# Patient Record
Sex: Female | Born: 1950 | Race: White | Hispanic: No | Marital: Married | Smoking: Never smoker
Health system: Southern US, Community
[De-identification: ages and names within clinical notes are randomized; demographics above are authoritative.]

## PROBLEM LIST (undated history)

## (undated) DIAGNOSIS — I10 Essential (primary) hypertension: Secondary | ICD-10-CM

## (undated) DIAGNOSIS — Z9109 Other allergy status, other than to drugs and biological substances: Secondary | ICD-10-CM

---

## 2005-10-27 ENCOUNTER — Ambulatory Visit: Payer: Self-pay

## 2008-03-13 ENCOUNTER — Emergency Department: Payer: Self-pay | Admitting: Emergency Medicine

## 2008-10-27 ENCOUNTER — Encounter: Admission: RE | Admit: 2008-10-27 | Discharge: 2008-10-27 | Payer: Self-pay | Admitting: Occupational Medicine

## 2009-02-08 ENCOUNTER — Ambulatory Visit: Payer: Self-pay | Admitting: Sports Medicine

## 2009-02-08 DIAGNOSIS — M7750 Other enthesopathy of unspecified foot: Secondary | ICD-10-CM

## 2009-03-01 ENCOUNTER — Ambulatory Visit: Payer: Self-pay | Admitting: Sports Medicine

## 2009-03-01 ENCOUNTER — Encounter (INDEPENDENT_AMBULATORY_CARE_PROVIDER_SITE_OTHER): Payer: Self-pay | Admitting: *Deleted

## 2009-03-01 DIAGNOSIS — M25476 Effusion, unspecified foot: Secondary | ICD-10-CM

## 2009-03-01 DIAGNOSIS — M205X9 Other deformities of toe(s) (acquired), unspecified foot: Secondary | ICD-10-CM

## 2009-03-01 DIAGNOSIS — M25473 Effusion, unspecified ankle: Secondary | ICD-10-CM

## 2009-06-05 ENCOUNTER — Telehealth (INDEPENDENT_AMBULATORY_CARE_PROVIDER_SITE_OTHER): Payer: Self-pay | Admitting: *Deleted

## 2009-06-07 ENCOUNTER — Ambulatory Visit: Payer: Self-pay

## 2009-06-07 DIAGNOSIS — R030 Elevated blood-pressure reading, without diagnosis of hypertension: Secondary | ICD-10-CM | POA: Insufficient documentation

## 2009-06-15 ENCOUNTER — Encounter: Payer: Self-pay | Admitting: Sports Medicine

## 2009-06-18 ENCOUNTER — Telehealth: Payer: Self-pay | Admitting: Sports Medicine

## 2009-06-20 ENCOUNTER — Encounter: Payer: Self-pay | Admitting: Sports Medicine

## 2009-07-11 ENCOUNTER — Ambulatory Visit: Payer: Self-pay | Admitting: Sports Medicine

## 2009-07-11 DIAGNOSIS — S92109A Unspecified fracture of unspecified talus, initial encounter for closed fracture: Secondary | ICD-10-CM

## 2011-04-17 ENCOUNTER — Ambulatory Visit: Payer: Self-pay

## 2012-09-27 ENCOUNTER — Ambulatory Visit: Payer: Self-pay

## 2012-10-27 ENCOUNTER — Ambulatory Visit: Payer: Self-pay

## 2012-11-22 ENCOUNTER — Ambulatory Visit: Payer: Self-pay | Admitting: Urology

## 2012-11-22 DIAGNOSIS — I10 Essential (primary) hypertension: Secondary | ICD-10-CM

## 2012-11-22 LAB — BASIC METABOLIC PANEL
Anion Gap: 6 — ABNORMAL LOW (ref 7–16)
Calcium, Total: 8.9 mg/dL (ref 8.5–10.1)
EGFR (African American): >60
EGFR (Non-African Amer.): >60
Glucose: 101 mg/dL — ABNORMAL HIGH (ref 65–99)
Sodium: 141 mmol/L (ref 136–145)

## 2014-07-10 IMAGING — CT CT STONE STUDY
1 of 2 series · 15 of 32 positions shown, 19 images · non-contrast
Comparison: None

REASON FOR EXAM: RUQ pain radiating to back Neg US Flank and back pain
ongoing for several mo...
COMMENTS:

PROCEDURE:     KCT - KCT ABDOMEN/PELVIS WO (STONE)  - October 27, 2012  [DATE]
RESULT:     Indication: Flank Pain
TECHNIQUE: Multiple axial images from the lung bases to the symphysis pubis
were obtained without oral and without intravenous contrast.

[Series 2: abd 3mm wo 3.0 i40f 3 · axial · 0.69mm/px · z∈[-956,-581]mm · 15 of 137 slices shown, 19 images]
[im 6/137  soft-tissue]
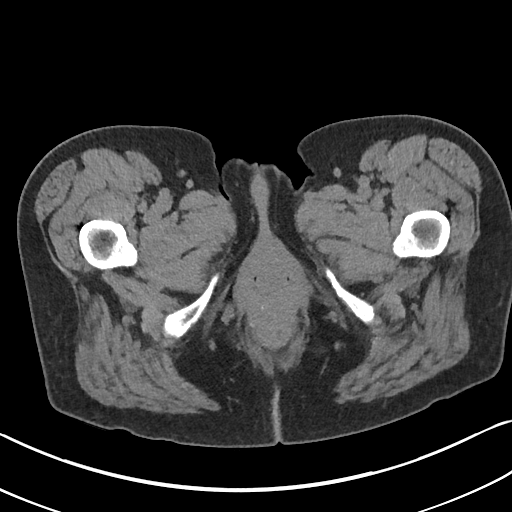
[im 6/137  bone]
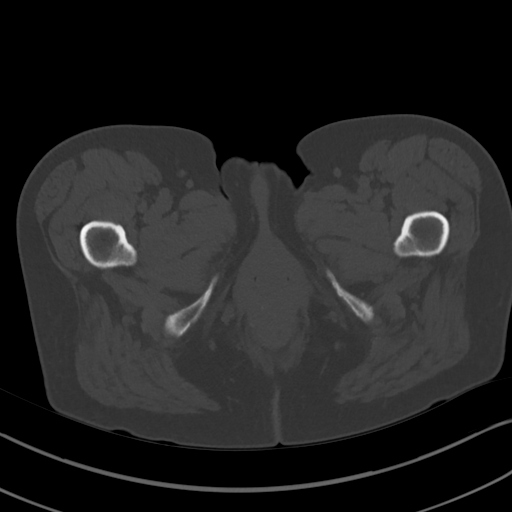
[im 16/137  soft-tissue]
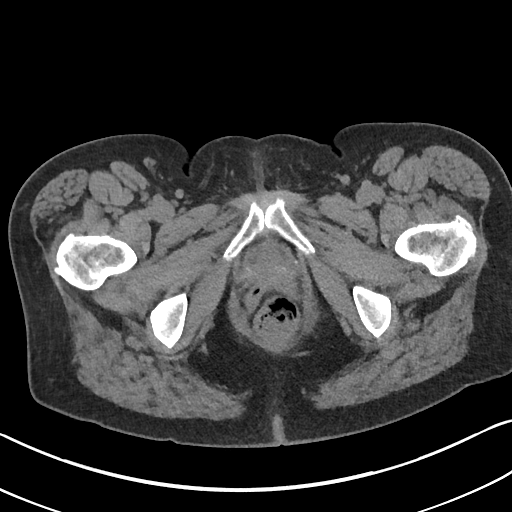
[im 27/137  soft-tissue]
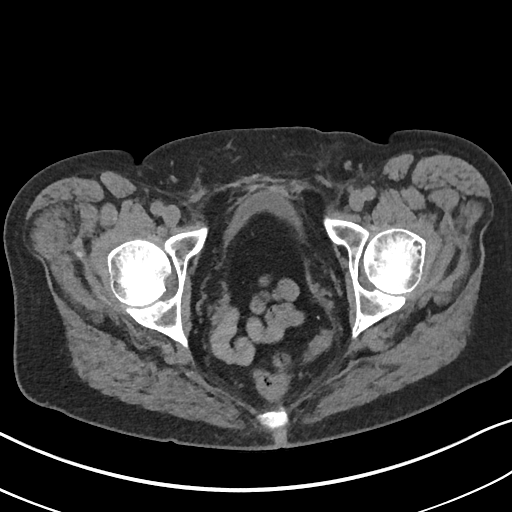
[im 37/137  soft-tissue]
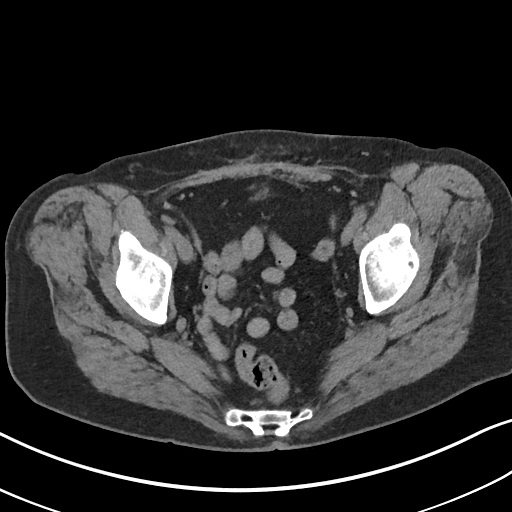
[im 48/137  soft-tissue]
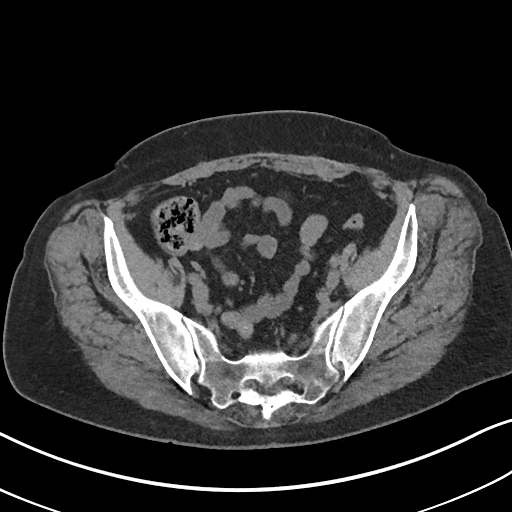
[im 58/137  soft-tissue]
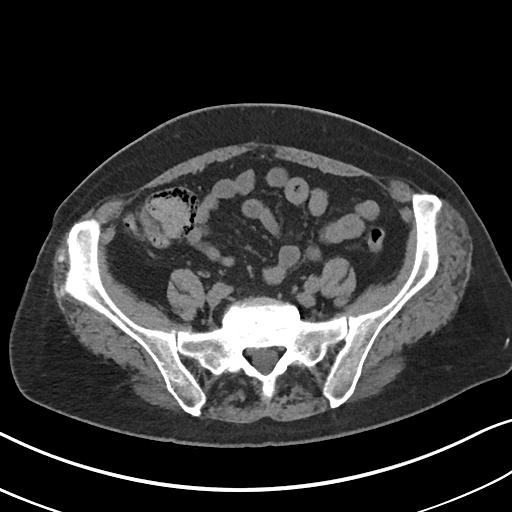
[im 69/137  soft-tissue]
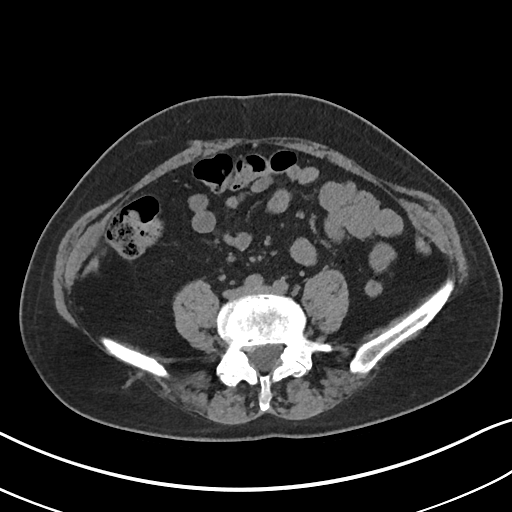
[im 79/137  soft-tissue]
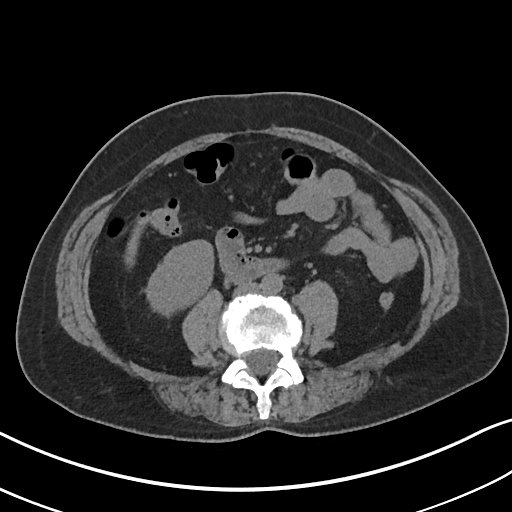
[im 89/137  soft-tissue]
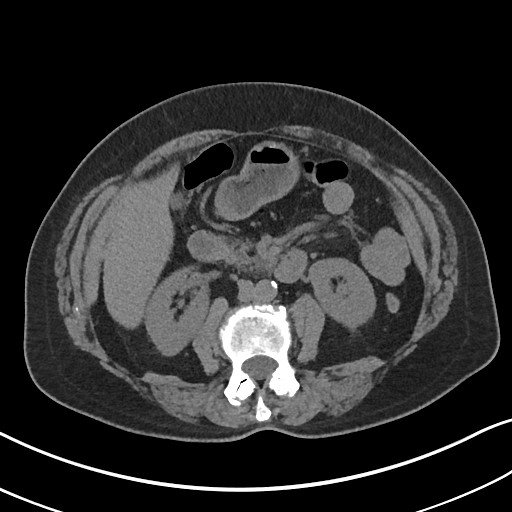
[im 89/137  bone]
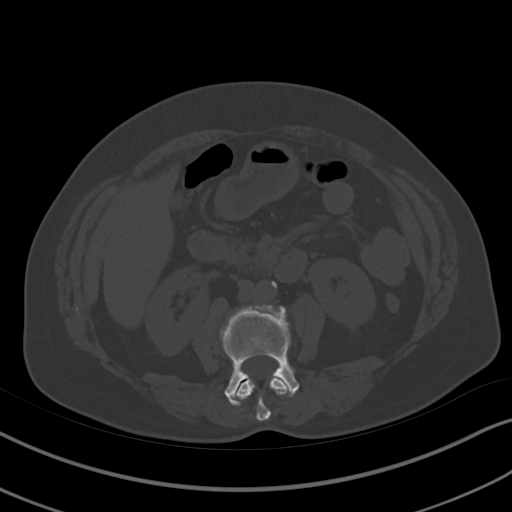
[im 100/137  soft-tissue]
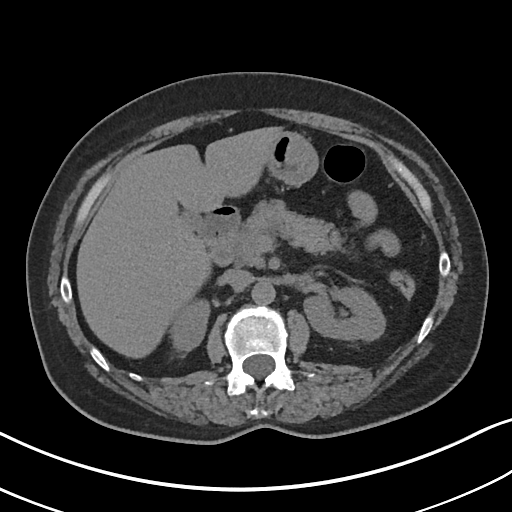
[im 110/137  soft-tissue]
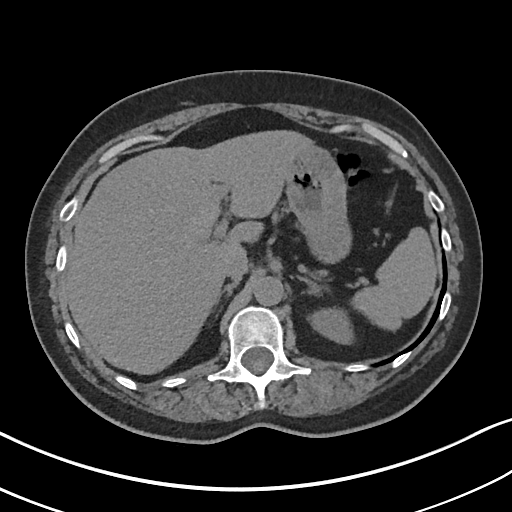
[im 116/137  lung]
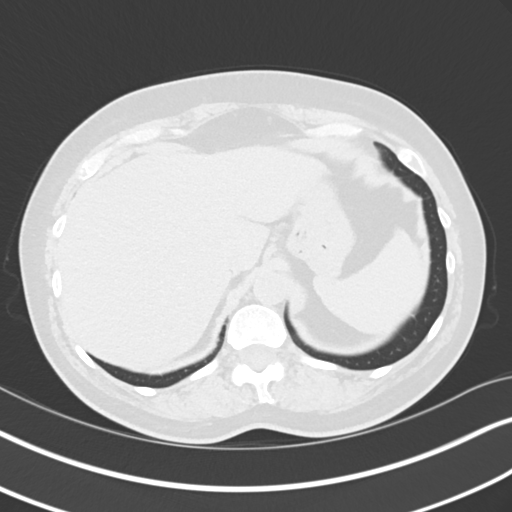
[im 121/137  soft-tissue]
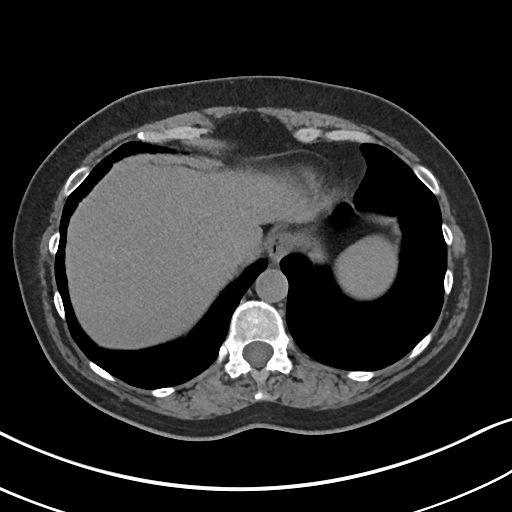
[im 121/137  lung]
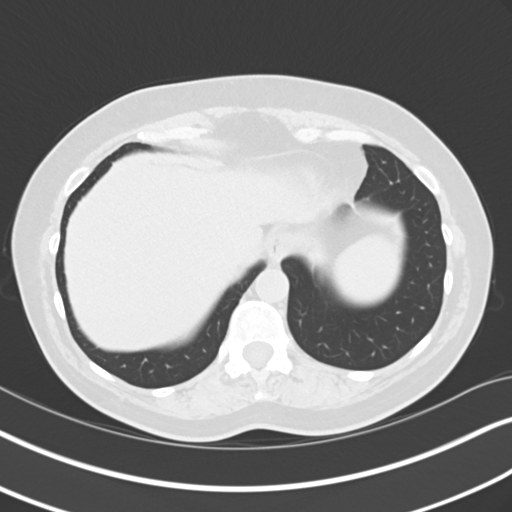
[im 126/137  lung]
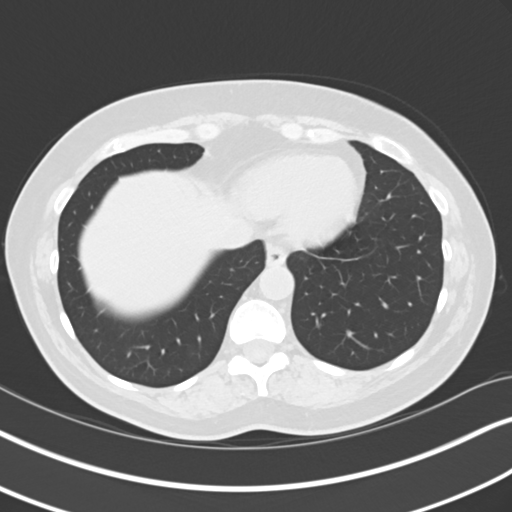
[im 131/137  soft-tissue]
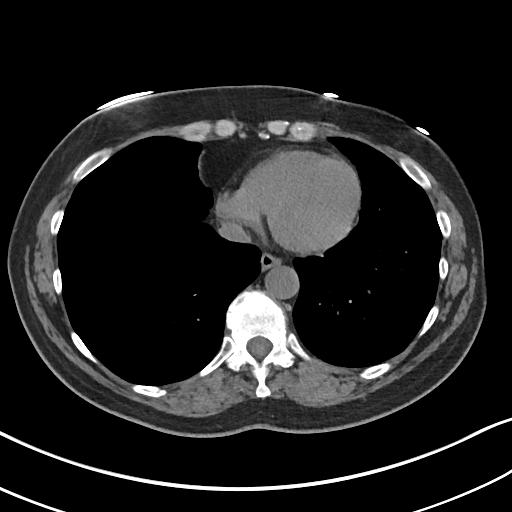
[im 131/137  lung]
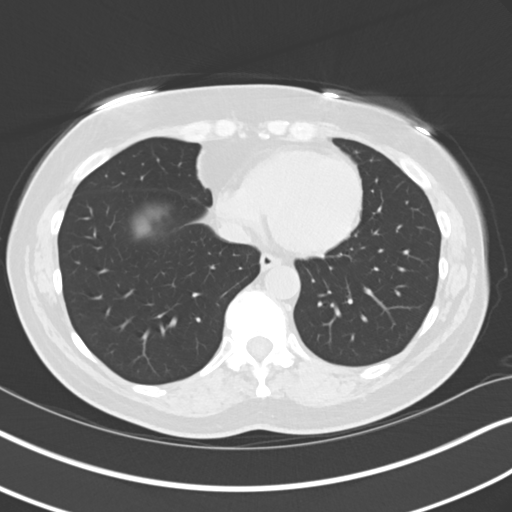

[15 of 32 positions shown; findings below may reference images not displayed]

FINDINGS: The lung bases are clear. There is no pleural or pericardial effusions.

No renal, ureteral, or bladder calculi. No obstructive uropathy. No
perinephric stranding is seen. The kidneys are symmetric in size without
evidence for exophytic mass. The bladder is unremarkable.

The liver demonstrates no focal abnormality. The gallbladder is
unremarkable. The spleen demonstrates no focal abnormality. The adrenal
glands and pancreas are normal.

The unopacified stomach, duodenum, small intestine, and large intestine are
unremarkable, but evaluation is limited by lack of oral contrast. There is
haziness in the left side of the mesenteric fat with a few prominent on
pathologically enlarged lymph nodes in this region as can be seen with
mesenteric adenitis. There is no pneumoperitoneum, pneumatosis, or portal
venous gas. There is no abdominal or pelvic free fluid. There is no
lymphadenopathy.

The abdominal aorta is normal in caliber.

The osseous structures are unremarkable.
IMPRESSION: 1. No urolithiasis or obstructive uropathy.

2. There is haziness in the left side of the mesenteric fat with a few
prominent on pathologically enlarged lymph nodes in this region as can be
seen with mesenteric adenitis.

[REDACTED]

## 2015-03-13 NOTE — H&P (Signed)
PATIENT NAME:  Alison Hurst, Alison Hurst MR#:  [id number] DATE OF BIRTH:  [birth year]  DATE OF ADMISSION:  11/22/2012  CHIEF COMPLAINT: Painful bladder.   HISTORY OF PRESENT ILLNESS: Alison Hurst is a 64 year old Alison Hurst female with a 2 to 3 year history of pelvic pain, frequency and urgency. She also has mixed urinary incontinence. She was found to have a UTI in early December and treated with a one-week course of Augmentin. Her symptoms have only improved slightly. She comes in now for cysto with hydrodilatation.  ALLERGIES: No drug allergies.   CURRENT MEDICATIONS: Amitriptyline, losartan, HCTZ and Xanax.   PAST SURGICAL HISTORY: Appendectomy and hysterectomy in 1979, pubovaginal sling and cystocele repair in 1997.   SOCIAL HISTORY: The patient denied tobacco use. She consumes 1 to 4 alcoholic beverages per week.   FAMILY HISTORY: Remarkable for father with lung cancer.   PAST AND CURRENT MEDICAL CONDITIONS:  1. Hypertension.  2. Depression.   REVIEW OF SYSTEMS: The patient denied chest pain, shortness of breath, diabetes or heart disease.   PHYSICAL EXAMINATION:  GENERAL: Well-nourished Alison Hurst female in no acute distress.   HEENT: Sclerae were clear. Pupils were equally round and reactive to light and accommodation. Extraocular movements were intact.   NECK: Supple. No palpable cervical lymphadenopathy. No audible carotid bruits.   PULMONARY: Lungs are clear to auscultation.   CARDIOVASCULAR: Regular rhythm and rate without audible murmurs.   ABDOMEN: Soft, nontender abdomen.   GENITOURINARY: Atrophic external genitalia. Good anterior support with negative Marshall test. No vaginal discharge. Normal urethral meatus. Postvoid residual was 130 mL.   RECTAL: No palpable rectal masses.   NEUROMUSCULAR: Alert and oriented x 3, nonfocal.   IMPRESSION: Painful bladder syndrome.   PLAN: Cystoscopy with hydrodilatation. ____________________________ Suszanne ConnersMichael R. Evelene CroonWolff, MD mrw:[id number] D: 11/15/2012  11:52:25 ET T: 11/15/2012 12:03:05 ET JOB#: [id number]  cc: Suszanne ConnersMichael R. Evelene CroonWolff, MD, <Dictator> Orson ApeMICHAEL R Annette Bertelson MD ELECTRONICALLY SIGNED 11/15/2012 15:58

## 2018-09-01 ENCOUNTER — Other Ambulatory Visit: Payer: Self-pay | Admitting: Nurse Practitioner

## 2018-09-01 DIAGNOSIS — Z1231 Encounter for screening mammogram for malignant neoplasm of breast: Secondary | ICD-10-CM

## 2020-01-08 ENCOUNTER — Other Ambulatory Visit: Payer: Self-pay

## 2020-01-08 ENCOUNTER — Ambulatory Visit
Admission: EM | Admit: 2020-01-08 | Discharge: 2020-01-08 | Disposition: A | Discharge status: Home / Self Care | Payer: Medicare PPO | Attending: Family Medicine | Admitting: Family Medicine

## 2020-01-08 ENCOUNTER — Ambulatory Visit (INDEPENDENT_AMBULATORY_CARE_PROVIDER_SITE_OTHER): Payer: Medicare PPO

## 2020-01-08 DIAGNOSIS — M545 Low back pain, unspecified: Secondary | ICD-10-CM

## 2020-01-08 HISTORY — DX: Other allergy status, other than to drugs and biological substances: Z91.09

## 2020-01-08 HISTORY — DX: Essential (primary) hypertension: I10

## 2020-01-08 MED ORDER — MELOXICAM 7.5 MG PO TABS: 7.5000 mg | ORAL_TABLET | Freq: Every day | ORAL | 0 refills | Status: AC | PRN

## 2020-01-08 MED ORDER — BACLOFEN 10 MG PO TABS: 10.0000 mg | ORAL_TABLET | Freq: Three times a day (TID) | ORAL | 0 refills | Status: DC | PRN

## 2020-01-08 NOTE — ED Triage Notes (Signed)
Pt presents with c/o fall this past Friday while at home. She fell backward while in the kitchen, she landed on her lower back. She reports LBP, pain with movement and ambulation. She denies any radiating pain to her LEs. She denies any other symptoms.

## 2020-01-08 NOTE — ED Provider Notes (Signed)
[facility]    CSN: [id number] Arrival date & time: 01/08/20  1228      History   Chief Complaint Chief Complaint  Patient presents with  . Back Pain   HPI   69 year old female presents with back pain/sacral pain.  Patient states that she suffered a fall on Friday.  She states that she was getting some food out of the oven and subsequently fell backwards.  She states that she injured her low back/sacrum.  She reports severe pain which is worse with ambulation.  Rates her pain as 8/10 in severity.  Described as sharp.  He has taken ibuprofen without relief.  No saddle anesthesia or incontinence.  No other reported symptoms.  No other complaints.  Past Medical History:  Diagnosis Date  . Environmental allergies   . Hypertension    Patient Active Problem List   Diagnosis Date Noted  . CLOSED FRACTURE OF ASTRAGALUS 07/11/2009  . ELEVATED BLOOD PRESSURE 06/07/2009  . JOINT EFFUSION, ANKLE 03/01/2009  . OTHER ACQUIRED DEFORMITY OF TOE 03/01/2009  . 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 ABDOMINAL HYSTERECTOMY    . APPENDECTOMY    . BLADDER SURGERY      OB History   No obstetric history on file.      Home Medications    Prior to Admission medications   Medication Sig Start Date End Date Taking? Authorizing Provider  ALPRAZolam Prudy Feeler) 0.5 MG tablet Take 0.25 mg by mouth daily as needed. 12/23/19  Yes [provider]  amitriptyline (ELAVIL) 75 MG tablet Take 75 mg by mouth at bedtime. 11/07/19  Yes [provider]  amLODipine (NORVASC) 5 MG tablet Take 5 mg by mouth daily. 11/18/19  Yes [provider]  busPIRone (BUSPAR) 10 MG tablet Take 10 mg by mouth 3 (three) times daily. 12/19/19  Yes [provider]  fluticasone (FLONASE) 50 MCG/ACT nasal spray Place 2 sprays into both nostrils daily as needed. 08/14/19  Yes [provider]  hydrochlorothiazide (HYDRODIURIL) 25  MG tablet Take 25 mg by mouth daily. 12/10/19  Yes [provider]  losartan (COZAAR) 100 MG tablet Take 100 mg by mouth daily. 12/10/19  Yes [provider]  montelukast (SINGULAIR) 10 MG tablet Take 1 tablet by mouth at bedtime. 07/29/19  Yes [provider]  baclofen (LIORESAL) 10 MG tablet Take 1 tablet (10 mg total) by mouth 3 (three) times daily as needed for muscle spasms. 01/08/20   Tommie Sams, DO  meloxicam (MOBIC) 7.5 MG tablet Take 1 tablet (7.5 mg total) by mouth daily as needed for pain. 01/08/20   Tommie Sams, DO    Family History Family History  Problem Relation Age of Onset  . Healthy Mother   . Healthy Father     Social History Social History   Tobacco Use  . Smoking status: Never Smoker  . Smokeless tobacco: Never Used  Substance Use Topics  . Alcohol use: Not Currently  . Drug use: Never     Allergies   Patient has no known allergies.   Review of Systems Review of Systems  Constitutional: Negative.   Musculoskeletal: Positive for back pain.   Physical Exam Triage Vital Signs ED Triage Vitals  Enc Vitals Group     BP 01/08/20 1259 119/84     Pulse Rate 01/08/20 1259 (!) 108     Resp --      Temp 01/08/20 1259 98.9  F (37.2 C)     Temp Source 01/08/20 1259 Oral     SpO2 01/08/20 1259 98 %     Weight 01/08/20 1254 148 lb (67.1 kg)     Height 01/08/20 1254 5' 3.5" (1.613 m)     Head Circumference --      Peak Flow --      Pain Score 01/08/20 1254 8     Pain Loc --      Pain Edu? --      Excl. in GC? --    Updated Vital Signs BP 119/84 (BP Location: Left Arm)   Pulse (!) 108   Temp 98.9 F (37.2 C) (Oral)   Ht 5' 3.5" (1.613 m)   Wt 67.1 kg   SpO2 98%   BMI 25.81 kg/m   Visual Acuity Right Eye Distance:   Left Eye Distance:   Bilateral Distance:    Right Eye Near:   Left Eye Near:    Bilateral Near:     Physical Exam Vitals and nursing note reviewed.  Constitutional:      Appearance: She is not  ill-appearing.     Comments: Appears in pain.  HENT:     Head: Normocephalic and atraumatic.  Eyes:     General:        Right eye: No discharge.        Left eye: No discharge.     Conjunctiva/sclera: Conjunctivae normal.  Cardiovascular:     Rate and Rhythm: Regular rhythm. Tachycardia present.  Pulmonary:     Effort: Pulmonary effort is normal.     Breath sounds: Normal breath sounds. No wheezing, rhonchi or rales.  Musculoskeletal:       Back:     Comments: Patient with tenderness over the lower lumbar spine in the midline as well as the sacrum.  Neurological:     Mental Status: She is alert.  Psychiatric:        Mood and Affect: Mood normal.        Behavior: Behavior normal.    UC Treatments / Results  Labs (all labs ordered are listed, but only abnormal results are displayed) Labs Reviewed - No data to display  EKG   Radiology DG Lumbar Spine Complete  Result Date: 01/08/2020 CLINICAL DATA:  Fall 2 days ago with back pain, initial encounter EXAM: LUMBAR SPINE - COMPLETE 4+ VIEW COMPARISON:  None. FINDINGS: Five lumbar type vertebral bodies are well visualized. Vertebral body height is well maintained. Multilevel osteophytic changes and disc space narrowing is seen. Facet hypertrophic changes are noted without pars defects. No anterolisthesis is noted. No soft tissue abnormality is noted. IMPRESSION: Multilevel degenerative change without acute abnormality. Electronically Signed   By: Alcide Clever M.D.   On: 01/08/2020 13:38   DG Sacrum/Coccyx  Result Date: 01/08/2020 CLINICAL DATA:  Fall 2 days ago with sacral pain, initial encounter EXAM: SACRUM AND COCCYX - 2+ VIEW COMPARISON:  None. FINDINGS: Pelvic ring is intact. Sacrum is well visualized without focal abnormality. No soft tissue changes are seen. IMPRESSION: No acute abnormality noted. Electronically Signed   By: Alcide Clever M.D.   On: 01/08/2020 13:38    Procedures Procedures (including critical care  time)  Medications Ordered in UC Medications - No data to display  Initial Impression / Assessment and Plan / UC Course  I have reviewed the triage vital signs and the nursing notes.  Pertinent labs & imaging results that were available during my care of the  patient were reviewed by me and considered in my medical decision making (see chart for details).    69 year old female presents with low back pain after suffering a fall.  X-rays negative for acute findings.  Meloxicam baclofen as directed.  Supportive care.  Final Clinical Impressions(s) / UC Diagnoses   Final diagnoses:  Acute bilateral low back pain without sciatica     Discharge Instructions     Heat.  Medication as prescribed.  Take care  Dr. Lacinda Axon     ED Prescriptions    Medication Sig Dispense Auth. Provider   meloxicam (MOBIC) 7.5 MG tablet Take 1 tablet (7.5 mg total) by mouth daily as needed for pain. 14 tablet Dannica Bickham G, DO   baclofen (LIORESAL) 10 MG tablet Take 1 tablet (10 mg total) by mouth 3 (three) times daily as needed for muscle spasms. 78 each Coral Spikes, DO     PDMP not reviewed this encounter.   Coral Spikes, [place] 01/08/20 1403

## 2020-01-08 NOTE — Discharge Instructions (Signed)
Heat.  Medication as prescribed.  Take care  Dr. Jaclene Bartelt  

## 2021-04-24 ENCOUNTER — Other Ambulatory Visit: Payer: Self-pay

## 2021-04-24 ENCOUNTER — Encounter: Payer: Self-pay | Admitting: Emergency Medicine

## 2021-04-24 ENCOUNTER — Ambulatory Visit
Admission: EM | Admit: 2021-04-24 | Discharge: 2021-04-24 | Disposition: A | Discharge status: Home / Self Care | Payer: Medicare PPO | Attending: Family Medicine | Admitting: Family Medicine

## 2021-04-24 DIAGNOSIS — Z28311 Partially vaccinated for covid-19: Secondary | ICD-10-CM | POA: Diagnosis not present

## 2021-04-24 DIAGNOSIS — I10 Essential (primary) hypertension: Secondary | ICD-10-CM | POA: Diagnosis not present

## 2021-04-24 DIAGNOSIS — Z20822 Contact with and (suspected) exposure to covid-19: Secondary | ICD-10-CM | POA: Diagnosis not present

## 2021-04-24 DIAGNOSIS — R051 Acute cough: Secondary | ICD-10-CM | POA: Diagnosis not present

## 2021-04-24 DIAGNOSIS — Z79899 Other long term (current) drug therapy: Secondary | ICD-10-CM | POA: Insufficient documentation

## 2021-04-24 DIAGNOSIS — J029 Acute pharyngitis, unspecified: Secondary | ICD-10-CM | POA: Diagnosis not present

## 2021-04-24 DIAGNOSIS — J069 Acute upper respiratory infection, unspecified: Secondary | ICD-10-CM | POA: Diagnosis not present

## 2021-04-24 DIAGNOSIS — H9202 Otalgia, left ear: Secondary | ICD-10-CM | POA: Insufficient documentation

## 2021-04-24 DIAGNOSIS — R059 Cough, unspecified: Secondary | ICD-10-CM | POA: Diagnosis not present

## 2021-04-24 LAB — GROUP A STREP BY PCR: Group A Strep by PCR: NOT DETECTED

## 2021-04-24 MED ORDER — HYDROCOD POLST-CPM POLST ER 10-8 MG/5ML PO SUER: 5.0000 mL | Freq: Two times a day (BID) | ORAL | 0 refills | Status: AC | PRN

## 2021-04-24 NOTE — ED Triage Notes (Signed)
Patient c/o cough and left ear pain that started Monday night. Denies fever.

## 2021-04-24 NOTE — Discharge Instructions (Signed)
I will call you if your strep test is positive.  The COVID test should be back tomorrow.  If you test positive for COVID-19 we will give you a call and we can consider starting you on an antiviral medicine.  If those tests are negative and this is likely another viral illness and will take about 7 to 10 days to run its course.  Care is supportive with increasing rest and fluids.  I sent a cough medicine for you.  Take this only if absolutely needed since it is strong.  I have also sent a nasal spray.  Your ear pain is likely due to eustachian tube dysfunction related to the fluid behind her ear.  It should get better over the next few days.  If you start to run a fever or feel worse or have difficulty breathing need to be seen again.  See more information about COVID below.  You have received COVID testing today either for positive exposure, concerning symptoms that could be related to COVID infection, screening purposes, or re-testing after confirmed positive.  Your test obtained today checks for active viral infection in the last 1-2 weeks. If your test is negative now, you can still test positive later. So, if you do develop symptoms you should either get re-tested and/or isolate x 5 days and then strict mask use x 5 days (unvaccinated) or mask use x 10 days (vaccinated). Please follow CDC guidelines.  While Rapid antigen tests come back in 15-20 minutes, send out PCR/molecular test results typically come back within 1-3 days. In the mean time, if you are symptomatic, assume this could be a positive test and treat/monitor yourself as if you do have COVID.   We will call with test results if positive. Please download the MyChart app and set up a profile to access test results.   If symptomatic, go home and rest. Push fluids. Take Tylenol as needed for discomfort. Gargle warm salt water. Throat lozenges. Take Mucinex DM or Robitussin for cough. Humidifier in bedroom to ease coughing. Warm showers. Also  review the COVID handout for more information.  COVID-19 INFECTION: The incubation period of COVID-19 is approximately 14 days after exposure, with most symptoms developing in roughly 4-5 days. Symptoms may range in severity from mild to critically severe. Roughly 80% of those infected will have mild symptoms. People of any age may become infected with COVID-19 and have the ability to transmit the virus. The most common symptoms include: fever, fatigue, cough, body aches, headaches, sore throat, nasal congestion, shortness of breath, nausea, vomiting, diarrhea, changes in smell and/or taste.    COURSE OF ILLNESS Some patients may begin with mild disease which can progress quickly into critical symptoms. If your symptoms are worsening please call ahead to the Emergency Department and proceed there for further treatment. Recovery time appears to be roughly 1-2 weeks for mild symptoms and 3-6 weeks for severe disease.   GO IMMEDIATELY TO ER FOR FEVER YOU ARE UNABLE TO GET DOWN WITH TYLENOL, BREATHING PROBLEMS, CHEST PAIN, FATIGUE, LETHARGY, INABILITY TO EAT OR DRINK, ETC  QUARANTINE AND ISOLATION: To help decrease the spread of COVID-19 please remain isolated if you have COVID infection or are highly suspected to have COVID infection. This means -stay home and isolate to one room in the home if you live with others. Do not share a bed or bathroom with others while ill, sanitize and wipe down all countertops and keep common areas clean and disinfected. Stay home for  5 days. If you have no symptoms or your symptoms are resolving after 5 days, you can leave your house. Continue to wear a mask around others for 5 additional days. If you have been in close contact (within 6 feet) of someone diagnosed with COVID 19, you are advised to quarantine in your home for 14 days as symptoms can develop anywhere from 2-14 days after exposure to the virus. If you develop symptoms, you  must isolate.  Most current guidelines  for COVID after exposure -unvaccinated: isolate 5 days and strict mask use x 5 days. Test on day 5 is possible -vaccinated: wear mask x 10 days if symptoms do not develop -You do not necessarily need to be tested for COVID if you have + exposure and  develop symptoms. Just isolate at home x10 days from symptom onset During this global pandemic, CDC advises to practice social distancing, try to stay at least 40ft away from others at all times. Wear a face covering. Wash and sanitize your hands regularly and avoid going anywhere that is not necessary.  KEEP IN MIND THAT THE COVID TEST IS NOT 100% ACCURATE AND YOU SHOULD STILL DO EVERYTHING TO PREVENT POTENTIAL SPREAD OF VIRUS TO OTHERS (WEAR MASK, WEAR GLOVES, WASH HANDS AND SANITIZE REGULARLY). IF INITIAL TEST IS NEGATIVE, THIS MAY NOT MEAN YOU ARE DEFINITELY NEGATIVE. MOST ACCURATE TESTING IS DONE 5-7 DAYS AFTER EXPOSURE.   It is not advised by CDC to get re-tested after receiving a positive COVID test since you can still test positive for weeks to months after you have already cleared the virus.   *If you have not been vaccinated for COVID, I strongly suggest you consider getting vaccinated as long as there are no contraindications.

## 2021-04-24 NOTE — ED Provider Notes (Signed)
[facility]    CSN: [id number] Arrival date & time: 04/24/21  1305      History   Chief Complaint Chief Complaint  Patient presents with  . Cough  . Ear Pain    HPI Alison Hurst is a 70 y.o. female   presenting for left-sided sharp ear pain that is intermittent, cough, mild nasal congestion, and sore throat x2 to 3 days.  She denies any fevers, fatigue, sinus pain, chest pain, wheezing or shortness of breath.  No abdominal pain, nausea/vomiting or diarrhea.  Patient exposed to sick grandchildren.  She states that one of them had a cough and a fever.  No known COVID exposure but the children were not tested.  Patient vaccinated for COVID-19 x2.  She has been taking over-the-counter decongestants without improvement in her symptoms.  Patient concerned because she said her cough is keeping her up at night.  Patient would like something stronger for cough.  Says that she has tried multiple over-the-counter medications and they have not helped.  Medical history significant for hypertension.  She has no other concerns.  HPI  Past Medical History:  Diagnosis Date  . Environmental allergies   . Hypertension     Patient Active Problem List   Diagnosis Date Noted  . CLOSED FRACTURE OF ASTRAGALUS 07/11/2009  . ELEVATED BLOOD PRESSURE 06/07/2009  . JOINT EFFUSION, ANKLE 03/01/2009  . OTHER ACQUIRED DEFORMITY OF TOE 03/01/2009  . 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 ABDOMINAL HYSTERECTOMY    . APPENDECTOMY    . BLADDER SURGERY      OB History   No obstetric history on file.      Home Medications    Prior to Admission medications   Medication Sig Start Date End Date Taking? Authorizing Provider  ALPRAZolam Prudy Feeler) 0.5 MG tablet Take 0.25 mg by mouth daily as needed. 12/23/19  Yes [provider]  amitriptyline (ELAVIL) 75 MG tablet Take 75 mg by mouth at bedtime. 11/07/19  Yes [provider]   amLODipine (NORVASC) 5 MG tablet Take 5 mg by mouth daily. 11/18/19  Yes [provider]  baclofen (LIORESAL) 10 MG tablet Take 1 tablet (10 mg total) by mouth 3 (three) times daily as needed for muscle spasms. 01/08/20  Yes Cook, Jayce G, DO  busPIRone (BUSPAR) 10 MG tablet Take 10 mg by mouth 3 (three) times daily. 12/19/19  Yes [provider]  chlorpheniramine-HYDROcodone (TUSSIONEX PENNKINETIC ER) 10-8 MG/5ML SUER Take 5 mLs by mouth every 12 (twelve) hours as needed for up to 7 days for cough. 04/24/21 05/01/21 Yes Eusebio Friendly B, PA-C  fluticasone (FLONASE) 50 MCG/ACT nasal spray Place 2 sprays into both nostrils daily as needed. 08/14/19  Yes [provider]  hydrochlorothiazide (HYDRODIURIL) 25 MG tablet Take 25 mg by mouth daily. 12/10/19  Yes [provider]  losartan (COZAAR) 100 MG tablet Take 100 mg by mouth daily. 12/10/19  Yes [provider]  meloxicam (MOBIC) 7.5 MG tablet Take 1 tablet (7.5 mg total) by mouth daily as needed for pain. 01/08/20  Yes Cook, Jayce G, DO  montelukast (SINGULAIR) 10 MG tablet Take 1 tablet by mouth at bedtime. 07/29/19  Yes [provider]    Family History Family History  Problem Relation Age of Onset  . Healthy Mother   . Healthy Father     Social History Social History   Tobacco Use  . Smoking status:  Never Smoker  . Smokeless tobacco: Never Used  Vaping Use  . Vaping Use: Never used  Substance Use Topics  . Alcohol use: Not Currently  . Drug use: Never     Allergies   Patient has no known allergies.   Review of Systems Review of Systems  Constitutional: Negative for chills, diaphoresis, fatigue and fever.  HENT: Positive for congestion, ear pain, rhinorrhea and sore throat. Negative for sinus pressure and sinus pain.   Respiratory: Positive for cough. Negative for shortness of breath.   Gastrointestinal: Negative for abdominal pain, nausea and vomiting.  Musculoskeletal: Negative  for arthralgias and myalgias.  Skin: Negative for rash.  Neurological: Negative for weakness and headaches.  Hematological: Negative for adenopathy.     Physical Exam Triage Vital Signs ED Triage Vitals  Enc Vitals Group     BP 04/24/21 1346 122/84     Pulse Rate 04/24/21 1346 96     Resp 04/24/21 1346 18     Temp 04/24/21 1346 98.6 F (37 C)     Temp Source 04/24/21 1346 Oral     SpO2 04/24/21 1346 98 %     Weight 04/24/21 1344 143 lb (64.9 kg)     Height 04/24/21 1344 5\' 3"  (1.6 m)     Head Circumference --      Peak Flow --      Pain Score 04/24/21 1344 0     Pain Loc --      Pain Edu? --      Excl. in [place]? --    No data found.  Updated Vital Signs BP 122/84 (BP Location: Right Arm)   Pulse 96   Temp 98.6 F (37 C) (Oral)   Resp 18   Ht 5\' 3"  (1.6 m)   Wt 143 lb (64.9 kg)   SpO2 98%   BMI 25.33 kg/m       Physical Exam Vitals and nursing note reviewed.  Constitutional:      General: She is not in acute distress.    Appearance: Normal appearance. She is not ill-appearing or toxic-appearing.  HENT:     Head: Normocephalic and atraumatic.     Right Ear: Tympanic membrane, ear canal and external ear normal.     Left Ear: Ear canal and external ear normal. A middle ear effusion is present.     Nose: Congestion present.     Mouth/Throat:     Mouth: Mucous membranes are moist.     Pharynx: Oropharynx is clear. Posterior oropharyngeal erythema present.  Eyes:     General: No scleral icterus.       Right eye: No discharge.        Left eye: No discharge.     Conjunctiva/sclera: Conjunctivae normal.  Cardiovascular:     Rate and Rhythm: Normal rate and regular rhythm.     Heart sounds: Normal heart sounds.  Pulmonary:     Effort: Pulmonary effort is normal. No respiratory distress.     Breath sounds: Normal breath sounds. No wheezing, rhonchi or rales.  Musculoskeletal:     Cervical back: Neck supple.  Skin:    General: Skin is dry.  Neurological:      General: No focal deficit present.     Mental Status: She is alert. Mental status is at baseline.     Motor: No weakness.     Gait: Gait normal.  Psychiatric:        Mood and Affect: Mood normal.  Behavior: Behavior normal.        Thought Content: Thought content normal.      UC Treatments / Results  Labs (all labs ordered are listed, but only abnormal results are displayed) Labs Reviewed  GROUP A STREP BY PCR  SARS CORONAVIRUS 2 (TAT 6-24 HRS)    EKG   Radiology No results found.  Procedures Procedures (including critical care time)  Medications Ordered in UC Medications - No data to display  Initial Impression / Assessment and Plan / UC Course  I have reviewed the triage vital signs and the nursing notes.  Pertinent labs & imaging results that were available during my care of the patient were reviewed by me and considered in my medical decision making (see chart for details).   70 year old female presenting for left-sided sharp ear pains, cough, and sore throat x2 to 3 days.  Vital signs all normal and stable and she is overall well-appearing.  Molecular strep test obtained.  Advised patient we will call with results of the are positive. COVID test obtained.  Current CDC guidelines, isolation protocol and ED precautions reviewed with patient.  Advised patient this is likely a viral URI if the strep test is negative and supportive care is advised.  I have sent in Tussionex after reviewing controlled substance database and finding patient to be low risk.  Patient has taken this medication before.  Advised her to use it very safely and only if absolutely needed.  Advised to follow-up if she develops any fevers, severe cough or worsening sore throat or has any breathing difficulty.    Advised patient if she test positive for COVID-19 she may be a candidate for antiviral or antibody therapy.  Negative strep.   Final Clinical Impressions(s) / UC Diagnoses   Final  diagnoses:  Upper respiratory tract infection, unspecified type  Cough  Sore throat     Discharge Instructions     I will call you if your strep test is positive.  The COVID test should be back tomorrow.  If you test positive for COVID-19 we will give you a call and we can consider starting you on an antiviral medicine.  If those tests are negative and this is likely another viral illness and will take about 7 to 10 days to run its course.  Care is supportive with increasing rest and fluids.  I sent a cough medicine for you.  Take this only if absolutely needed since it is strong.  I have also sent a nasal spray.  Your ear pain is likely due to eustachian tube dysfunction related to the fluid behind her ear.  It should get better over the next few days.  If you start to run a fever or feel worse or have difficulty breathing need to be seen again.  See more information about COVID below.  You have received COVID testing today either for positive exposure, concerning symptoms that could be related to COVID infection, screening purposes, or re-testing after confirmed positive.  Your test obtained today checks for active viral infection in the last 1-2 weeks. If your test is negative now, you can still test positive later. So, if you do develop symptoms you should either get re-tested and/or isolate x 5 days and then strict mask use x 5 days (unvaccinated) or mask use x 10 days (vaccinated). Please follow CDC guidelines.  While Rapid antigen tests come back in 15-20 minutes, send out PCR/molecular test results typically come back within 1-3 days. In  the mean time, if you are symptomatic, assume this could be a positive test and treat/monitor yourself as if you do have COVID.   We will call with test results if positive. Please download the MyChart app and set up a profile to access test results.   If symptomatic, go home and rest. Push fluids. Take Tylenol as needed for discomfort. Gargle warm salt  water. Throat lozenges. Take Mucinex DM or Robitussin for cough. Humidifier in bedroom to ease coughing. Warm showers. Also review the COVID handout for more information.  COVID-19 INFECTION: The incubation period of COVID-19 is approximately 14 days after exposure, with most symptoms developing in roughly 4-5 days. Symptoms may range in severity from mild to critically severe. Roughly 80% of those infected will have mild symptoms. People of any age may become infected with COVID-19 and have the ability to transmit the virus. The most common symptoms include: fever, fatigue, cough, body aches, headaches, sore throat, nasal congestion, shortness of breath, nausea, vomiting, diarrhea, changes in smell and/or taste.    COURSE OF ILLNESS Some patients may begin with mild disease which can progress quickly into critical symptoms. If your symptoms are worsening please call ahead to the Emergency Department and proceed there for further treatment. Recovery time appears to be roughly 1-2 weeks for mild symptoms and 3-6 weeks for severe disease.   GO IMMEDIATELY TO ER FOR FEVER YOU ARE UNABLE TO GET DOWN WITH TYLENOL, BREATHING PROBLEMS, CHEST PAIN, FATIGUE, LETHARGY, INABILITY TO EAT OR DRINK, ETC  QUARANTINE AND ISOLATION: To help decrease the spread of COVID-19 please remain isolated if you have COVID infection or are highly suspected to have COVID infection. This means -stay home and isolate to one room in the home if you live with others. Do not share a bed or bathroom with others while ill, sanitize and wipe down all countertops and keep common areas clean and disinfected. Stay home for 5 days. If you have no symptoms or your symptoms are resolving after 5 days, you can leave your house. Continue to wear a mask around others for 5 additional days. If you have been in close contact (within 6 feet) of someone diagnosed with COVID 19, you are advised to quarantine in your home for 14 days as symptoms can develop  anywhere from 2-14 days after exposure to the virus. If you develop symptoms, you  must isolate.  Most current guidelines for COVID after exposure -unvaccinated: isolate 5 days and strict mask use x 5 days. Test on day 5 is possible -vaccinated: wear mask x 10 days if symptoms do not develop -You do not necessarily need to be tested for COVID if you have + exposure and  develop symptoms. Just isolate at home x10 days from symptom onset During this global pandemic, CDC advises to practice social distancing, try to stay at least 64ft away from others at all times. Wear a face covering. Wash and sanitize your hands regularly and avoid going anywhere that is not necessary.  KEEP IN MIND THAT THE COVID TEST IS NOT 100% ACCURATE AND YOU SHOULD STILL DO EVERYTHING TO PREVENT POTENTIAL SPREAD OF VIRUS TO OTHERS (WEAR MASK, WEAR GLOVES, WASH HANDS AND SANITIZE REGULARLY). IF INITIAL TEST IS NEGATIVE, THIS MAY NOT MEAN YOU ARE DEFINITELY NEGATIVE. MOST ACCURATE TESTING IS DONE 5-7 DAYS AFTER EXPOSURE.   It is not advised by CDC to get re-tested after receiving a positive COVID test since you can still test positive for weeks to months after  you have already cleared the virus.   *If you have not been vaccinated for COVID, I strongly suggest you consider getting vaccinated as long as there are no contraindications.      ED Prescriptions    Medication Sig Dispense Auth. Provider   chlorpheniramine-HYDROcodone (TUSSIONEX PENNKINETIC ER) 10-8 MG/5ML SUER Take 5 mLs by mouth every 12 (twelve) hours as needed for up to 7 days for cough. 70 mL Shirlee Latch, PA-C     I have reviewed the PDMP during this encounter.   Shirlee Latch, PA-C 04/24/21 1607

## 2021-04-25 LAB — SARS CORONAVIRUS 2 (TAT 6-24 HRS): SARS Coronavirus 2: NEGATIVE

## 2021-04-27 ENCOUNTER — Encounter: Payer: Self-pay | Admitting: Emergency Medicine

## 2021-04-27 ENCOUNTER — Ambulatory Visit
Admission: EM | Admit: 2021-04-27 | Discharge: 2021-04-27 | Disposition: A | Discharge status: Home / Self Care | Payer: Medicare PPO | Attending: Emergency Medicine | Admitting: Emergency Medicine

## 2021-04-27 ENCOUNTER — Other Ambulatory Visit: Payer: Self-pay

## 2021-04-27 DIAGNOSIS — J040 Acute laryngitis: Secondary | ICD-10-CM | POA: Diagnosis not present

## 2021-04-27 DIAGNOSIS — J0100 Acute maxillary sinusitis, unspecified: Secondary | ICD-10-CM

## 2021-04-27 DIAGNOSIS — H109 Unspecified conjunctivitis: Secondary | ICD-10-CM

## 2021-04-27 MED ORDER — MOXIFLOXACIN HCL 0.5 % OP SOLN: 1.0000 [drp] | Freq: Three times a day (TID) | OPHTHALMIC | 0 refills | Status: AC

## 2021-04-27 MED ORDER — AMOXICILLIN-POT CLAVULANATE 875-125 MG PO TABS: 1.0000 | ORAL_TABLET | Freq: Two times a day (BID) | ORAL | 0 refills | Status: AC

## 2021-04-27 MED ORDER — PREDNISONE 10 MG (21) PO TBPK: ORAL_TABLET | ORAL | 0 refills | Status: DC

## 2021-04-27 NOTE — Discharge Instructions (Addendum)
The Augmentin twice daily with food for 10 days for treatment of your sinusitis.  Perform sinus irrigation 2-3 times a day with a NeilMed sinus rinse kit and distilled water.  Do not use tap water.  You can use plain over-the-counter Mucinex every 6 hours to break up the stickiness of the mucus so your body can clear it.  Increase your oral fluid intake to thin out your mucus so that is also able for your body to clear more easily.  Take an over-the-counter probiotic, such as Culturelle-align-activia, 1 hour after each dose of antibiotic to prevent diarrhea.  For your laryngitis:  Start the prednisone Dosepak tomorrow morning and take it according to the package instructions.  For your conjunctivitis:  Start the Vigamox drops and take them for 7 days.  You will instill 1 drop in each eye 3 times a day.  If you develop any new or worsening symptoms return for reevaluation or see your primary care provider.

## 2021-04-27 NOTE — ED Provider Notes (Signed)
[facility]    CSN: [id number] Arrival date & time: 04/27/21  1359      History   Chief Complaint Chief Complaint  Patient presents with  . Eye Problem    bilateral    HPI Alison Hurst is a 70 y.o. female.   HPI   70 year old female here for evaluation of eye issues and continuing upper respiratory problems.  Patient reports that for the last 3 days she has had increasing eye redness with itching and watery discharge as well as yellow crusted drainage on her lashes in the morning when she wakes up.  She denies any visual changes or fever.  In addition, she has developed bloody nasal discharge, hoarseness, and a productive cough for green sputum.  Past Medical History:  Diagnosis Date  . Environmental allergies   . Hypertension     Patient Active Problem List   Diagnosis Date Noted  . CLOSED FRACTURE OF ASTRAGALUS 07/11/2009  . ELEVATED BLOOD PRESSURE 06/07/2009  . JOINT EFFUSION, ANKLE 03/01/2009  . OTHER ACQUIRED DEFORMITY OF TOE 03/01/2009  . 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 ABDOMINAL HYSTERECTOMY    . APPENDECTOMY    . BLADDER SURGERY      OB History   No obstetric history on file.      Home Medications    Prior to Admission medications   Medication Sig Start Date End Date Taking? Authorizing Provider  amoxicillin-clavulanate (AUGMENTIN) 875-125 MG tablet Take 1 tablet by mouth every 12 (twelve) hours for 10 days. 04/27/21 05/07/21 Yes Margarette Canada, NP  moxifloxacin (VIGAMOX) 0.5 % ophthalmic solution Place 1 drop into both eyes 3 (three) times daily for 7 days. 04/27/21 05/04/21 Yes Margarette Canada, NP  predniSONE (STERAPRED UNI-PAK 21 TAB) 10 MG (21) TBPK tablet Take 6 tablets on day 1, 5 tablets day 2, 4 tablets day 3, 3 tablets day 4, 2 tablets day 5, 1 tablet day 6 04/27/21  Yes Margarette Canada, NP  ALPRAZolam Duanne Moron) 0.5 MG tablet Take 0.25 mg by mouth daily as needed. 12/23/19   [provider]  amitriptyline (ELAVIL) 75 MG tablet Take 75 mg by mouth at bedtime. 11/07/19   [provider]  amLODipine (NORVASC) 5 MG tablet Take 5 mg by mouth daily. 11/18/19   [provider]  baclofen (LIORESAL) 10 MG tablet Take 1 tablet (10 mg total) by mouth 3 (three) times daily as needed for muscle spasms. 01/08/20   Coral Spikes, DO  busPIRone (BUSPAR) 10 MG tablet Take 10 mg by mouth 3 (three) times daily. 12/19/19   [provider]  chlorpheniramine-HYDROcodone (TUSSIONEX PENNKINETIC ER) 10-8 MG/5ML SUER Take 5 mLs by mouth every 12 (twelve) hours as needed for up to 7 days for cough. 04/24/21 05/01/21  Laurene Footman B, PA-C  fluticasone (FLONASE) 50 MCG/ACT nasal spray Place 2 sprays into both nostrils daily as needed. 08/14/19   [provider]  hydrochlorothiazide (HYDRODIURIL) 25 MG tablet Take 25 mg by mouth daily. 12/10/19   [provider]  losartan (COZAAR) 100 MG tablet Take 100 mg by mouth daily. 12/10/19   [provider]  meloxicam (MOBIC) 7.5 MG tablet Take 1 tablet (7.5 mg total) by mouth daily as needed for pain. 01/08/20   Coral Spikes, DO  montelukast (SINGULAIR) 10 MG tablet Take 1 tablet by mouth at bedtime. 07/29/19   [provider]    Family History Family History  Problem Relation Age of Onset  . Healthy Mother   . Healthy Father     Social History Social History   Tobacco Use  . Smoking status: Never Smoker  . Smokeless tobacco: Never Used  Vaping Use  . Vaping Use: Never used  Substance Use Topics  . Alcohol use: Not Currently  . Drug use: Never     Allergies   Patient has no known allergies.   Review of Systems Review of Systems  Constitutional: Negative for activity change, appetite change and fever.  HENT: Positive for congestion, rhinorrhea and voice change.   Eyes: Positive for discharge, redness and itching. Negative for visual disturbance.  Respiratory: Positive for cough.       Physical Exam Triage Vital Signs ED Triage Vitals  Enc Vitals Group     BP 04/27/21 1450 121/80     Pulse Rate 04/27/21 1450 95     Resp 04/27/21 1450 14     Temp 04/27/21 1450 98.9 F (37.2 C)     Temp Source 04/27/21 1450 Oral     SpO2 04/27/21 1450 99 %     Weight 04/27/21 1449 143 lb (64.9 kg)     Height 04/27/21 1449 $RemoveBefor'5\' 3"'[id number]$  (1.6 m)     Head Circumference --      Peak Flow --      Pain Score 04/27/21 1449 0     Pain Loc --      Pain Edu? --      Excl. in [place]? --    No data found.  Updated Vital Signs BP 121/80 (BP Location: Right Arm)   Pulse 95   Temp 98.9 F (37.2 C) (Oral)   Resp 14   Ht $R'5\' 3"'bH$  (1.6 m)   Wt 143 lb (64.9 kg)   SpO2 99%   BMI 25.33 kg/m   Visual Acuity Right Eye Distance:   Left Eye Distance:   Bilateral Distance:    Right Eye Near:   Left Eye Near:    Bilateral Near:     Physical Exam Vitals and nursing note reviewed.  Constitutional:      General: She is not in acute distress.    Appearance: Normal appearance. She is not ill-appearing.  HENT:     Head: Normocephalic and atraumatic.     Right Ear: Tympanic membrane, ear canal and external ear normal. There is no impacted cerumen.     Left Ear: Tympanic membrane, ear canal and external ear normal. There is no impacted cerumen.     Nose: Congestion and rhinorrhea present.  Eyes:     General: No scleral icterus.       Right eye: Discharge present.        Left eye: Discharge present.    Extraocular Movements: Extraocular movements intact.     Pupils: Pupils are equal, round, and reactive to light.  Musculoskeletal:     Cervical back: Normal range of motion and neck supple.  Lymphadenopathy:     Cervical: No cervical adenopathy.  Skin:    General: Skin is warm and dry.     Capillary Refill: Capillary refill takes less than 2 seconds.     Findings: No rash.  Neurological:     General: No focal deficit present.     Mental Status: She is alert and oriented to person, place, and  time.  Psychiatric:        Mood and Affect: Mood normal.        Behavior: Behavior  normal.        Thought Content: Thought content normal.        Judgment: Judgment normal.      UC Treatments / Results  Labs (all labs ordered are listed, but only abnormal results are displayed) Labs Reviewed - No data to display  EKG   Radiology No results found.  Procedures Procedures (including critical care time)  Medications Ordered in UC Medications - No data to display  Initial Impression / Assessment and Plan / UC Course  I have reviewed the triage vital signs and the nursing notes.  Pertinent labs & imaging results that were available during my care of the patient were reviewed by me and considered in my medical decision making (see chart for details).   Patient is a very pleasant 70 year old female here for evaluation of worsening URI symptoms and red, itchy, watery eyes that have been going on for the past 3 days.  Patient was evaluated in this urgent care and diagnosed with a URI 3 days ago.  At that time she been having 3 days worth of upper respiratory symptoms.  She was negative for COVID and strep at that time.  Physical exam today reveals pearly gray tympanic membranes bilaterally with a normal light reflex and clear external auditory canals.  Nasal mucosa is markedly erythematous and edematous with bloody nasal discharge on the left.  Oropharyngeal exam is unremarkable.  No cervical lymphadenopathy appreciated exam.  Bilateral bulbar and bilateral conjunctiva are injected and there is yellow mucopurulent discharge in the inner canthus of both eyes.  We will treat patient for sinusitis with Augmentin twice daily for 10 days, prednisone to help with her laryngitis, and Vigamox for her bacterial conjunctivitis.   Final Clinical Impressions(s) / UC Diagnoses   Final diagnoses:  Conjunctivitis of both eyes, unspecified conjunctivitis type  Acute non-recurrent maxillary sinusitis   Laryngitis     Discharge Instructions     The Augmentin twice daily with food for 10 days for treatment of your sinusitis.  Perform sinus irrigation 2-3 times a day with a NeilMed sinus rinse kit and distilled water.  Do not use tap water.  You can use plain over-the-counter Mucinex every 6 hours to break up the stickiness of the mucus so your body can clear it.  Increase your oral fluid intake to thin out your mucus so that is also able for your body to clear more easily.  Take an over-the-counter probiotic, such as Culturelle-align-activia, 1 hour after each dose of antibiotic to prevent diarrhea.  For your laryngitis:  Start the prednisone Dosepak tomorrow morning and take it according to the package instructions.  For your conjunctivitis:  Start the Vigamox drops and take them for 7 days.  You will instill 1 drop in each eye 3 times a day.  If you develop any new or worsening symptoms return for reevaluation or see your primary care provider.     ED Prescriptions    Medication Sig Dispense Auth. Provider   amoxicillin-clavulanate (AUGMENTIN) 875-125 MG tablet Take 1 tablet by mouth every 12 (twelve) hours for 10 days. 20 tablet Margarette Canada, NP   predniSONE (STERAPRED UNI-PAK 21 TAB) 10 MG (21) TBPK tablet Take 6 tablets on day 1, 5 tablets day 2, 4 tablets day 3, 3 tablets day 4, 2 tablets day 5, 1 tablet day 6 21 tablet Margarette Canada, NP   moxifloxacin (VIGAMOX) 0.5 % ophthalmic solution Place 1 drop into both eyes 3 (three) times daily for  7 days. 3 mL Margarette Canada, NP     PDMP not reviewed this encounter.   Margarette Canada, NP 04/27/21 1510

## 2021-04-27 NOTE — ED Triage Notes (Signed)
Patient reports itchy and watery eyes that started on Thursday morning.  Patient has some redness in both eyes.  Patient denies any vision problems.

## 2022-01-21 DIAGNOSIS — Z0000 Encounter for general adult medical examination without abnormal findings: Secondary | ICD-10-CM | POA: Diagnosis not present

## 2022-01-21 DIAGNOSIS — I10 Essential (primary) hypertension: Secondary | ICD-10-CM | POA: Diagnosis not present

## 2022-01-21 DIAGNOSIS — F419 Anxiety disorder, unspecified: Secondary | ICD-10-CM | POA: Diagnosis not present

## 2022-01-21 DIAGNOSIS — Z79899 Other long term (current) drug therapy: Secondary | ICD-10-CM | POA: Diagnosis not present

## 2022-01-21 DIAGNOSIS — J301 Allergic rhinitis due to pollen: Secondary | ICD-10-CM | POA: Diagnosis not present

## 2022-01-21 DIAGNOSIS — R7302 Impaired glucose tolerance (oral): Secondary | ICD-10-CM | POA: Diagnosis not present

## 2022-01-21 DIAGNOSIS — Z1389 Encounter for screening for other disorder: Secondary | ICD-10-CM | POA: Diagnosis not present

## 2022-01-21 DIAGNOSIS — K5900 Constipation, unspecified: Secondary | ICD-10-CM | POA: Diagnosis not present

## 2022-01-21 DIAGNOSIS — E782 Mixed hyperlipidemia: Secondary | ICD-10-CM | POA: Diagnosis not present

## 2022-04-02 DIAGNOSIS — M6289 Other specified disorders of muscle: Secondary | ICD-10-CM | POA: Diagnosis not present

## 2022-04-02 DIAGNOSIS — K5909 Other constipation: Secondary | ICD-10-CM | POA: Diagnosis not present

## 2022-04-22 DIAGNOSIS — Z79899 Other long term (current) drug therapy: Secondary | ICD-10-CM | POA: Diagnosis not present

## 2022-04-22 DIAGNOSIS — E782 Mixed hyperlipidemia: Secondary | ICD-10-CM | POA: Diagnosis not present

## 2022-04-22 DIAGNOSIS — F419 Anxiety disorder, unspecified: Secondary | ICD-10-CM | POA: Diagnosis not present

## 2022-04-22 DIAGNOSIS — J301 Allergic rhinitis due to pollen: Secondary | ICD-10-CM | POA: Diagnosis not present

## 2022-04-22 DIAGNOSIS — F32A Depression, unspecified: Secondary | ICD-10-CM | POA: Diagnosis not present

## 2022-04-22 DIAGNOSIS — R300 Dysuria: Secondary | ICD-10-CM | POA: Diagnosis not present

## 2022-04-22 DIAGNOSIS — R7302 Impaired glucose tolerance (oral): Secondary | ICD-10-CM | POA: Diagnosis not present

## 2022-04-22 DIAGNOSIS — I10 Essential (primary) hypertension: Secondary | ICD-10-CM | POA: Diagnosis not present

## 2022-05-13 DIAGNOSIS — H2513 Age-related nuclear cataract, bilateral: Secondary | ICD-10-CM | POA: Diagnosis not present

## 2022-05-13 DIAGNOSIS — H5213 Myopia, bilateral: Secondary | ICD-10-CM | POA: Diagnosis not present

## 2022-05-14 DIAGNOSIS — R300 Dysuria: Secondary | ICD-10-CM | POA: Diagnosis not present

## 2022-05-14 DIAGNOSIS — Z87440 Personal history of urinary (tract) infections: Secondary | ICD-10-CM | POA: Diagnosis not present

## 2022-05-23 DIAGNOSIS — Z87440 Personal history of urinary (tract) infections: Secondary | ICD-10-CM | POA: Diagnosis not present

## 2022-05-23 DIAGNOSIS — R300 Dysuria: Secondary | ICD-10-CM | POA: Diagnosis not present

## 2022-08-01 DIAGNOSIS — R509 Fever, unspecified: Secondary | ICD-10-CM | POA: Diagnosis not present

## 2022-08-01 DIAGNOSIS — Z79899 Other long term (current) drug therapy: Secondary | ICD-10-CM | POA: Diagnosis not present

## 2022-08-01 DIAGNOSIS — R7302 Impaired glucose tolerance (oral): Secondary | ICD-10-CM | POA: Diagnosis not present

## 2022-08-01 DIAGNOSIS — R399 Unspecified symptoms and signs involving the genitourinary system: Secondary | ICD-10-CM | POA: Diagnosis not present

## 2022-08-01 DIAGNOSIS — F32A Depression, unspecified: Secondary | ICD-10-CM | POA: Diagnosis not present

## 2022-08-01 DIAGNOSIS — F419 Anxiety disorder, unspecified: Secondary | ICD-10-CM | POA: Diagnosis not present

## 2022-08-01 DIAGNOSIS — B349 Viral infection, unspecified: Secondary | ICD-10-CM | POA: Diagnosis not present

## 2022-08-01 DIAGNOSIS — Z03818 Encounter for observation for suspected exposure to other biological agents ruled out: Secondary | ICD-10-CM | POA: Diagnosis not present

## 2022-08-01 DIAGNOSIS — E782 Mixed hyperlipidemia: Secondary | ICD-10-CM | POA: Diagnosis not present

## 2022-08-01 DIAGNOSIS — N390 Urinary tract infection, site not specified: Secondary | ICD-10-CM | POA: Diagnosis not present

## 2022-08-01 DIAGNOSIS — I10 Essential (primary) hypertension: Secondary | ICD-10-CM | POA: Diagnosis not present

## 2022-08-13 ENCOUNTER — Encounter: Payer: Self-pay | Admitting: Emergency Medicine

## 2022-08-13 ENCOUNTER — Ambulatory Visit
Admission: EM | Admit: 2022-08-13 | Discharge: 2022-08-13 | Disposition: A | Discharge status: Home / Self Care | Payer: Medicare PPO | Attending: Physician Assistant | Admitting: Physician Assistant

## 2022-08-13 DIAGNOSIS — R109 Unspecified abdominal pain: Secondary | ICD-10-CM | POA: Insufficient documentation

## 2022-08-13 DIAGNOSIS — R300 Dysuria: Secondary | ICD-10-CM | POA: Insufficient documentation

## 2022-08-13 DIAGNOSIS — R319 Hematuria, unspecified: Secondary | ICD-10-CM | POA: Insufficient documentation

## 2022-08-13 DIAGNOSIS — N390 Urinary tract infection, site not specified: Secondary | ICD-10-CM | POA: Diagnosis not present

## 2022-08-13 LAB — URINALYSIS, ROUTINE W REFLEX MICROSCOPIC
Glucose, UA: NEGATIVE mg/dL
Ketones, ur: NEGATIVE mg/dL
Nitrite: NEGATIVE
Specific Gravity, Urine: 1.01 (ref 1.005–1.030)
pH: 6.5 (ref 5.0–8.0)

## 2022-08-13 LAB — URINALYSIS, MICROSCOPIC (REFLEX): WBC, UA: >50 WBC/hpf (ref 0–5)

## 2022-08-13 MED ORDER — PHENAZOPYRIDINE HCL 200 MG PO TABS: 200.0000 mg | ORAL_TABLET | Freq: Three times a day (TID) | ORAL | 0 refills | Status: AC

## 2022-08-13 MED ORDER — CIPROFLOXACIN HCL 500 MG PO TABS: 500.0000 mg | ORAL_TABLET | Freq: Two times a day (BID) | ORAL | 0 refills | Status: AC

## 2022-08-13 NOTE — ED Provider Notes (Signed)
[facility]    CSN: [id number] Arrival date & time: 08/13/22  1410      History   Chief Complaint Chief Complaint  Patient presents with   Bladder pain     HPI Alison Hurst is a 71 y.o. female with history of recurrent urinary tract infections.  Patient presents today for dysuria, bladder spasms and pain, lower back pain.  Patient was seen by her PCP on 08/01/2022 for urinary tract infection and was prescribed 10-day course of Augmentin.  Patient says she thought she was getting better on the medication but when she completed it symptoms seem to acutely worsen and her pain is worse than ever.  She denies any fever but has had chills.  No report of any blood in the urine, nausea/vomiting or vaginal discharge.  Patient reports she has a history of UTIs and has had to have more than 1 course of antibiotics in the past to treat a UTI.  She has an appointment with her PCP next week to recheck her electrolytes.  Unfortunately, PCP was out of the office today when patient tried to follow-up.  No other complaints.  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      Home Medications    Prior to Admission medications   Medication Sig Start Date End Date Taking? Authorizing Provider  ciprofloxacin (CIPRO) 500 MG tablet Take 1 tablet (500 mg total) by mouth every 12 (twelve) hours for 7 days. 08/13/22 08/20/22 Yes Shirlee Latch, PA-C  phenazopyridine (PYRIDIUM) 200 MG tablet Take 1 tablet (200 mg total) by mouth 3 (three)  times daily. 08/13/22  Yes Shirlee Latch, PA-C  ALPRAZolam Prudy Feeler) 0.5 MG tablet Take 0.25 mg by mouth daily as needed. 12/23/19   [provider]  amitriptyline (ELAVIL) 75 MG tablet Take 75 mg by mouth at bedtime. 11/07/19   [provider]  amLODipine (NORVASC) 5 MG tablet Take 5 mg by mouth daily. 11/18/19   [provider]  baclofen (LIORESAL) 10 MG tablet Take 1 tablet (10 mg total) by mouth 3 (three) times daily as needed for muscle spasms. 01/08/20   Tommie Sams, DO  busPIRone (BUSPAR) 10 MG tablet Take 10 mg by mouth 3 (three) times daily. 12/19/19   [provider]  fluticasone (FLONASE) 50 MCG/ACT nasal spray Place 2 sprays into both nostrils daily as needed. 08/14/19   [provider]  hydrochlorothiazide (HYDRODIURIL) 25 MG tablet Take 25 mg by mouth daily. 12/10/19   [provider]  losartan (COZAAR) 100 MG tablet Take 100 mg by mouth daily. 12/10/19   [provider]  meloxicam (MOBIC) 7.5 MG tablet Take 1 tablet (7.5 mg total) by mouth daily as needed for pain. 01/08/20   Tommie Sams, DO  montelukast (SINGULAIR) 10 MG tablet Take 1 tablet by mouth at bedtime. 07/29/19   [provider]  predniSONE (STERAPRED UNI-PAK 21 TAB) 10 MG (21) TBPK  tablet Take 6 tablets on day 1, 5 tablets day 2, 4 tablets day 3, 3 tablets day 4, 2 tablets day 5, 1 tablet day 6 04/27/21   Becky Augustayan, Jeremy, NP    Family History Family History  Problem Relation Age of Onset   Healthy Mother    Healthy Father     Social History Social History   Tobacco Use   Smoking status: Never   Smokeless tobacco: Never  Vaping Use   Vaping Use: Never used  Substance Use Topics   Alcohol use: Not Currently   Drug use: Never     Allergies   Patient has no known allergies.   Review of Systems Review of Systems  Constitutional:  Negative for chills, fatigue and fever.  Gastrointestinal:  Positive for abdominal pain. Negative for diarrhea,  nausea and vomiting.  Genitourinary:  Positive for difficulty urinating, dysuria, frequency and urgency. Negative for decreased urine volume, flank pain, hematuria, pelvic pain, vaginal bleeding, vaginal discharge and vaginal pain.  Musculoskeletal:  Positive for back pain.  Skin:  Negative for rash.     Physical Exam Triage Vital Signs ED Triage Vitals  Enc Vitals Group     BP 08/13/22 1514 123/79     Pulse Rate 08/13/22 1514 90     Resp 08/13/22 1514 16     Temp 08/13/22 1514 97.9 F (36.6 C)     Temp Source 08/13/22 1514 Oral     SpO2 08/13/22 1514 100 %     Weight --      Height --      Head Circumference --      Peak Flow --      Pain Score 08/13/22 1512 10     Pain Loc --      Pain Edu? --      Excl. in GC? --    No data found.  Updated Vital Signs BP 123/79 (BP Location: Left Arm)   Pulse 90   Temp 97.9 F (36.6 C) (Oral)   Resp 16   SpO2 100%   Physical Exam Vitals and nursing note reviewed.  Constitutional:      General: She is not in acute distress.    Appearance: Normal appearance. She is not ill-appearing or toxic-appearing.  HENT:     Head: Normocephalic and atraumatic.  Eyes:     General: No scleral icterus.       Right eye: No discharge.        Left eye: No discharge.     Conjunctiva/sclera: Conjunctivae normal.  Cardiovascular:     Rate and Rhythm: Normal rate and regular rhythm.     Heart sounds: Normal heart sounds.  Pulmonary:     Effort: Pulmonary effort is normal. No respiratory distress.     Breath sounds: Normal breath sounds.  Abdominal:     Palpations: Abdomen is soft.     Tenderness: There is no abdominal tenderness. There is left CVA tenderness. There is no right CVA tenderness.  Musculoskeletal:     Cervical back: Neck supple.  Skin:    General: Skin is dry.  Neurological:     General: No focal deficit present.     Mental Status: She is alert. Mental status is at baseline.     Motor: No weakness.     Gait: Gait normal.   Psychiatric:        Mood and Affect: Mood normal.        Behavior: Behavior normal.  Thought Content: Thought content normal.      UC Treatments / Results  Labs (all labs ordered are listed, but only abnormal results are displayed) Labs Reviewed  URINALYSIS, ROUTINE W REFLEX MICROSCOPIC - Abnormal; Notable for the following components:      Result Value   APPearance CLOUDY (*)    Hgb urine dipstick SMALL (*)    Bilirubin Urine SMALL (*)    Protein, ur TRACE (*)    Leukocytes,Ua LARGE (*)    All other components within normal limits  URINALYSIS, MICROSCOPIC (REFLEX) - Abnormal; Notable for the following components:   Bacteria, UA MANY (*)    All other components within normal limits  URINE CULTURE    EKG   Radiology No results found.  Procedures Procedures (including critical care time)  Medications Ordered in UC Medications - No data to display  Initial Impression / Assessment and Plan / UC Course  I have reviewed the triage vital signs and the nursing notes.  Pertinent labs & imaging results that were available during my care of the patient were reviewed by me and considered in my medical decision making (see chart for details).   71 year old female presenting for urinary tract infection.  She was recently treated with 10-day course of Augmentin from 08/01/2022 to 08/11/2022 by primary care provider.  No culture was obtained.  Vitals are stable today.  Patient is overall well-appearing but does appear uncomfortable.  On exam abdomen is soft nontender.  She has left sided CVA tenderness.  Urinalysis obtained today shows cloudy urine with small hemoglobin, trace protein and large leukocytes with many bacteria and Savitz blood cell clumps on microscopic analysis.  We will send urine for culture.  Advised patient she has continued urinary tract infection.  Patient reports taking numerous antibiotics in the past.  She says she has taken Cipro in the past and done well  with it.  We will send Cipro at this time as I am concerned about ascending urinary tract infection to her kidney.  Also sent Pyridium and encouraged her to continue to increase her fluids and follow-up with her PCP.  ED precautions thoroughly reviewed.   Final Clinical Impressions(s) / UC Diagnoses   Final diagnoses:  Urinary tract infection with hematuria, site unspecified  Left flank pain  Dysuria     Discharge Instructions      UTI: Based on either symptoms or urinalysis, you may have a urinary tract infection. We will send the urine for culture and call with results in a few days. Begin antibiotics at this time. Your symptoms should be much improved over the next 2-3 days. Increase rest and fluid intake. If for some reason symptoms are worsening or not improving after a couple of days or the urine culture determines the antibiotics you are taking will not treat the infection, the antibiotics may be changed. Return or go to ER for fever, back pain, worsening urinary pain, discharge, increased blood in urine. May take Tylenol or Motrin OTC for pain relief or consider AZO if no contraindications   If you have fever, increased flank pain or lower abdominal pain, weakness, or are not improving in the next 2 to 3 days, you may need to be seen again and reevaluated.  Go to emergency department for any acute worsening of her symptoms, otherwise follow-up with your PCP.     ED Prescriptions     Medication Sig Dispense Auth. Provider   ciprofloxacin (CIPRO) 500 MG tablet Take 1 tablet (  500 mg total) by mouth every 12 (twelve) hours for 7 days. 14 tablet Laurene Footman B, PA-C   phenazopyridine (PYRIDIUM) 200 MG tablet Take 1 tablet (200 mg total) by mouth 3 (three) times daily. 6 tablet Gretta Cool      PDMP not reviewed this encounter.   Danton Clap, PA-C 08/13/22 [phone]

## 2022-08-13 NOTE — Discharge Instructions (Addendum)
UTI: Based on either symptoms or urinalysis, you may have a urinary tract infection. We will send the urine for culture and call with results in a few days. Begin antibiotics at this time. Your symptoms should be much improved over the next 2-3 days. Increase rest and fluid intake. If for some reason symptoms are worsening or not improving after a couple of days or the urine culture determines the antibiotics you are taking will not treat the infection, the antibiotics may be changed. Return or go to ER for fever, back pain, worsening urinary pain, discharge, increased blood in urine. May take Tylenol or Motrin OTC for pain relief or consider AZO if no contraindications   If you have fever, increased flank pain or lower abdominal pain, weakness, or are not improving in the next 2 to 3 days, you may need to be seen again and reevaluated.  Go to emergency department for any acute worsening of her symptoms, otherwise follow-up with your PCP.

## 2022-08-13 NOTE — ED Triage Notes (Signed)
Pt presents with bladder pain and back pain x 3 days. She states the pain started after finishing abx for a UTI.

## 2022-08-15 LAB — URINE CULTURE: Culture: 50000 — AB

## 2022-08-20 DIAGNOSIS — N179 Acute kidney failure, unspecified: Secondary | ICD-10-CM | POA: Diagnosis not present

## 2022-09-29 DIAGNOSIS — Z1231 Encounter for screening mammogram for malignant neoplasm of breast: Secondary | ICD-10-CM | POA: Diagnosis not present

## 2022-12-15 DIAGNOSIS — M62838 Other muscle spasm: Secondary | ICD-10-CM | POA: Diagnosis not present

## 2022-12-15 DIAGNOSIS — E782 Mixed hyperlipidemia: Secondary | ICD-10-CM | POA: Diagnosis not present

## 2022-12-15 DIAGNOSIS — Z79899 Other long term (current) drug therapy: Secondary | ICD-10-CM | POA: Diagnosis not present

## 2022-12-15 DIAGNOSIS — F419 Anxiety disorder, unspecified: Secondary | ICD-10-CM | POA: Diagnosis not present

## 2022-12-15 DIAGNOSIS — I10 Essential (primary) hypertension: Secondary | ICD-10-CM | POA: Diagnosis not present

## 2022-12-15 DIAGNOSIS — J301 Allergic rhinitis due to pollen: Secondary | ICD-10-CM | POA: Diagnosis not present

## 2022-12-15 DIAGNOSIS — R8290 Unspecified abnormal findings in urine: Secondary | ICD-10-CM | POA: Diagnosis not present

## 2022-12-15 DIAGNOSIS — R7302 Impaired glucose tolerance (oral): Secondary | ICD-10-CM | POA: Diagnosis not present

## 2022-12-15 DIAGNOSIS — Z0000 Encounter for general adult medical examination without abnormal findings: Secondary | ICD-10-CM | POA: Diagnosis not present

## 2023-03-20 DIAGNOSIS — Z79899 Other long term (current) drug therapy: Secondary | ICD-10-CM | POA: Diagnosis not present

## 2023-03-20 DIAGNOSIS — I10 Essential (primary) hypertension: Secondary | ICD-10-CM | POA: Diagnosis not present

## 2023-03-20 DIAGNOSIS — F419 Anxiety disorder, unspecified: Secondary | ICD-10-CM | POA: Diagnosis not present

## 2023-03-20 DIAGNOSIS — F32A Depression, unspecified: Secondary | ICD-10-CM | POA: Diagnosis not present

## 2023-03-20 DIAGNOSIS — J301 Allergic rhinitis due to pollen: Secondary | ICD-10-CM | POA: Diagnosis not present

## 2023-03-20 DIAGNOSIS — E782 Mixed hyperlipidemia: Secondary | ICD-10-CM | POA: Diagnosis not present

## 2023-03-20 DIAGNOSIS — R7302 Impaired glucose tolerance (oral): Secondary | ICD-10-CM | POA: Diagnosis not present

## 2023-07-01 DIAGNOSIS — J301 Allergic rhinitis due to pollen: Secondary | ICD-10-CM | POA: Diagnosis not present

## 2023-07-01 DIAGNOSIS — F32A Depression, unspecified: Secondary | ICD-10-CM | POA: Diagnosis not present

## 2023-07-01 DIAGNOSIS — Z79899 Other long term (current) drug therapy: Secondary | ICD-10-CM | POA: Diagnosis not present

## 2023-07-01 DIAGNOSIS — E782 Mixed hyperlipidemia: Secondary | ICD-10-CM | POA: Diagnosis not present

## 2023-07-01 DIAGNOSIS — R7302 Impaired glucose tolerance (oral): Secondary | ICD-10-CM | POA: Diagnosis not present

## 2023-07-01 DIAGNOSIS — I10 Essential (primary) hypertension: Secondary | ICD-10-CM | POA: Diagnosis not present

## 2023-07-01 DIAGNOSIS — R300 Dysuria: Secondary | ICD-10-CM | POA: Diagnosis not present

## 2023-07-01 DIAGNOSIS — F419 Anxiety disorder, unspecified: Secondary | ICD-10-CM | POA: Diagnosis not present

## 2023-10-02 DIAGNOSIS — F32A Depression, unspecified: Secondary | ICD-10-CM | POA: Diagnosis not present

## 2023-10-02 DIAGNOSIS — Z79899 Other long term (current) drug therapy: Secondary | ICD-10-CM | POA: Diagnosis not present

## 2023-10-02 DIAGNOSIS — J301 Allergic rhinitis due to pollen: Secondary | ICD-10-CM | POA: Diagnosis not present

## 2023-10-02 DIAGNOSIS — R7302 Impaired glucose tolerance (oral): Secondary | ICD-10-CM | POA: Diagnosis not present

## 2023-10-02 DIAGNOSIS — Z1331 Encounter for screening for depression: Secondary | ICD-10-CM | POA: Diagnosis not present

## 2023-10-02 DIAGNOSIS — I10 Essential (primary) hypertension: Secondary | ICD-10-CM | POA: Diagnosis not present

## 2023-10-02 DIAGNOSIS — F419 Anxiety disorder, unspecified: Secondary | ICD-10-CM | POA: Diagnosis not present

## 2023-10-02 DIAGNOSIS — N390 Urinary tract infection, site not specified: Secondary | ICD-10-CM | POA: Diagnosis not present

## 2023-10-02 DIAGNOSIS — E782 Mixed hyperlipidemia: Secondary | ICD-10-CM | POA: Diagnosis not present

## 2023-10-02 DIAGNOSIS — Z23 Encounter for immunization: Secondary | ICD-10-CM | POA: Diagnosis not present

## 2023-10-07 DIAGNOSIS — Z1231 Encounter for screening mammogram for malignant neoplasm of breast: Secondary | ICD-10-CM | POA: Diagnosis not present

## 2023-10-26 ENCOUNTER — Ambulatory Visit
Admission: EM | Admit: 2023-10-26 | Discharge: 2023-10-26 | Disposition: A | Discharge status: Home / Self Care | Payer: Medicare PPO | Attending: Internal Medicine | Admitting: Internal Medicine

## 2023-10-26 DIAGNOSIS — M5442 Lumbago with sciatica, left side: Secondary | ICD-10-CM

## 2023-10-26 MED ORDER — KETOROLAC TROMETHAMINE 30 MG/ML IJ SOLN
30.0000 mg | Freq: Once | INTRAMUSCULAR | Status: AC
  Administered 2023-10-26: 30 mg via INTRAMUSCULAR

## 2023-10-26 MED ORDER — PREDNISONE 10 MG (21) PO TBPK: ORAL_TABLET | Freq: Every day | ORAL | 0 refills | Status: AC

## 2023-10-26 MED ORDER — METHYLPREDNISOLONE ACETATE 80 MG/ML IJ SUSP
40.0000 mg | Freq: Once | INTRAMUSCULAR | Status: AC
  Administered 2023-10-26: 40 mg via INTRAMUSCULAR

## 2023-10-26 MED ORDER — METHOCARBAMOL 750 MG PO TABS: 750.0000 mg | ORAL_TABLET | Freq: Two times a day (BID) | ORAL | 0 refills | Status: AC | PRN

## 2023-10-26 NOTE — ED Provider Notes (Signed)
[facility]    CSN: [id number] Arrival date & time: 10/26/23  1253      History   Chief Complaint Chief Complaint  Patient presents with   Back Pain    HPI Alison Hurst is a 72 y.o. female presents for back pain.  Patient reports 3 weeks of a consistent left-sided low back pain that radiates down into her left leg.  Denies any known injury or inciting event.  Does report a history of low back pain in general.  Denies any numbness/tingling/weakness of her lower extremities, no bowel or bladder incontinence, no saddle paresthesia.  No history of back surgeries or fractures.  She has been using meloxicam and over-the-counter Aleve with minimal improvement.  States she has not taken any OTC medications or prescribed medications for her back pain.  No other concerns at this time.   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      Home Medications    Prior to Admission medications   Medication Sig Start Date End Date Taking? Authorizing Provider  ALPRAZolam Prudy Feeler) 0.5 MG tablet Take 0.25 mg by mouth daily as needed. 12/23/19  Yes [provider]  amitriptyline (ELAVIL) 75 MG tablet Take 75 mg by mouth at bedtime. 11/07/19  Yes [provider]  amLODipine (NORVASC) 5 MG tablet Take 5 mg by mouth daily. 11/18/19  Yes [provider]  busPIRone (BUSPAR) 10 MG tablet Take 10 mg by mouth 3 (three) times daily. 12/19/19  Yes [provider]  fluticasone (FLONASE) 50  MCG/ACT nasal spray Place 2 sprays into both nostrils daily as needed. 08/14/19  Yes [provider]  hydrochlorothiazide (HYDRODIURIL) 25 MG tablet Take 25 mg by mouth daily. 12/10/19  Yes [provider]  losartan (COZAAR) 100 MG tablet Take 100 mg by mouth daily. 12/10/19  Yes [provider]  meloxicam (MOBIC) 7.5 MG tablet Take 1 tablet (7.5 mg total) by mouth daily as needed for pain. 01/08/20  Yes Cook, Jayce G, DO  methocarbamol (ROBAXIN-750) 750 MG tablet Take 1 tablet (750 mg total) by mouth 2 (two) times daily as needed for muscle spasms. 10/26/23  Yes Radford Pax, NP  montelukast (SINGULAIR) 10 MG tablet Take 1 tablet by mouth at bedtime. 07/29/19  Yes [provider]  omeprazole (PRILOSEC) 40 MG capsule Take by mouth. 10/02/23  Yes [provider]  phenazopyridine (PYRIDIUM) 200 MG tablet Take 1 tablet (200 mg total) by mouth 3 (three) times daily. 08/13/22  Yes Shirlee Latch, PA-C  predniSONE (STERAPRED UNI-PAK 21 TAB) 10 MG (21) TBPK tablet Take by mouth daily. Take 6 tabs by mouth daily  for 1 day, then 5 tabs for 1 day, then 4 tabs for 1 day, then 3 tabs for 1 day, 2 tabs for 1 day, then 1 tab by mouth daily for 1 days 10/27/23  Yes Radford Pax, NP    Family History Family  History  Problem Relation Age of Onset   Healthy Mother    Healthy Father     Social History Social History   Tobacco Use   Smoking status: Never   Smokeless tobacco: Never  Vaping Use   Vaping status: Never Used  Substance Use Topics   Alcohol use: Not Currently   Drug use: Never     Allergies   Patient has no known allergies.   Review of Systems Review of Systems  Musculoskeletal:  Positive for back pain.     Physical Exam Triage Vital Signs ED Triage Vitals  Encounter Vitals Group     BP 10/26/23 1454 (!) 136/95     Systolic BP Percentile --      Diastolic BP Percentile --      Pulse Rate 10/26/23 1454 94     Resp --      Temp 10/26/23  1454 98 F (36.7 C)     Temp Source 10/26/23 1454 Oral     SpO2 10/26/23 1454 100 %     Weight --      Height --      Head Circumference --      Peak Flow --      Pain Score 10/26/23 1452 10     Pain Loc --      Pain Education --      Exclude from Growth Chart --    No data found.  Updated Vital Signs BP (!) 136/95 (BP Location: Left Arm)   Pulse 94   Temp 98 F (36.7 C) (Oral)   SpO2 100%   Visual Acuity Right Eye Distance:   Left Eye Distance:   Bilateral Distance:    Right Eye Near:   Left Eye Near:    Bilateral Near:     Physical Exam Vitals and nursing note reviewed.  Constitutional:      General: She is not in acute distress.    Appearance: Normal appearance. She is not ill-appearing.  HENT:     Head: Normocephalic and atraumatic.  Eyes:     Pupils: Pupils are equal, round, and reactive to light.  Cardiovascular:     Rate and Rhythm: Normal rate.  Pulmonary:     Effort: Pulmonary effort is normal.  Musculoskeletal:     Lumbar back: Spasms and tenderness present. No swelling, edema, deformity, signs of trauma, lacerations or bony tenderness. Normal range of motion. Positive left straight leg raise test. Negative right straight leg raise test. No scoliosis.       Back:     Comments: Strength 5 out of 5 bilateral lower extremities  Skin:    General: Skin is warm and dry.  Neurological:     General: No focal deficit present.     Mental Status: She is alert and oriented to person, place, and time.  Psychiatric:        Mood and Affect: Mood normal.        Behavior: Behavior normal.      UC Treatments / Results  Labs (all labs ordered are listed, but only abnormal results are displayed) Labs Reviewed - No data to display  EKG   Radiology No results found.  Procedures Procedures (including critical care time)  Medications Ordered in UC Medications  ketorolac (TORADOL) 30 MG/ML injection 30 mg (has no administration in time range)   methylPREDNISolone acetate (DEPO-MEDROL) injection 40 mg (has no administration in time range)    Initial Impression / Assessment and Plan /  UC Course  I have reviewed the triage vital signs and the nursing notes.  Pertinent labs & imaging results that were available during my care of the patient were reviewed by me and considered in my medical decision making (see chart for details).     Reviewed exam and symptoms with patient.  No red flags.  Patient given Toradol and Depo-Medrol injection in clinic.  She was monitored for 10 minutes after injection with no reaction noted and tolerated well.  Instructed no NSAIDs for 24 hours and verbalized understanding.  Will start trial of Robaxin, side effect profile reviewed.  Start prednisone taper tomorrow, 12/2.  Advised heat and rest.  PCP follow-up if symptoms do not improve.  ER precautions reviewed. Final Clinical Impressions(s) / UC Diagnoses   Final diagnoses:  Acute left-sided low back pain with left-sided sciatica     Discharge Instructions      You were given a Toradol injection in clinic today. Do not take any over the counter NSAID's such as Advil, ibuprofen, Aleve, or naproxen for 24 hours. You may take tylenol if needed.  You may start Robaxin twice a day as needed.  Please note this medication can make you drowsy.  Do not drink alcohol or drive while on this medication.  Start prednisone taper tomorrow, 12/3.  Heat and rest to the back.  Please follow-up with your PCP in 2 to 3 days for recheck.  Please go to the ER for any worsening symptoms.  I hope you feel better soon!      ED Prescriptions     Medication Sig Dispense Auth. Provider   methocarbamol (ROBAXIN-750) 750 MG tablet Take 1 tablet (750 mg total) by mouth 2 (two) times daily as needed for muscle spasms. 10 tablet Radford Pax, NP   predniSONE (STERAPRED UNI-PAK 21 TAB) 10 MG (21) TBPK tablet Take by mouth daily. Take 6 tabs by mouth daily  for 1 day, then 5 tabs  for 1 day, then 4 tabs for 1 day, then 3 tabs for 1 day, 2 tabs for 1 day, then 1 tab by mouth daily for 1 days 21 tablet Radford Pax, NP      PDMP not reviewed this encounter.   Radford Pax, NP 10/26/23 1520

## 2023-10-26 NOTE — Discharge Instructions (Addendum)
You were given a Toradol injection in clinic today. Do not take any over the counter NSAID's such as Advil, ibuprofen, Aleve, or naproxen for 24 hours. You may take tylenol if needed.  You may start Robaxin twice a day as needed.  Please note this medication can make you drowsy.  Do not drink alcohol or drive while on this medication.  Start prednisone taper tomorrow, 12/3.  Heat and rest to the back.  Please follow-up with your PCP in 2 to 3 days for recheck.  Please go to the ER for any worsening symptoms.  I hope you feel better soon!

## 2023-10-26 NOTE — ED Triage Notes (Addendum)
Pt presents to UC c/o lower back pain radiating into LT leg x3 weeks. Pt states started out as ache but has progressed since then. Pt has been taking etodolac and Elixicon for pain but not helping.

## 2024-02-29 DIAGNOSIS — I10 Essential (primary) hypertension: Secondary | ICD-10-CM | POA: Diagnosis not present

## 2024-02-29 DIAGNOSIS — Z1331 Encounter for screening for depression: Secondary | ICD-10-CM | POA: Diagnosis not present

## 2024-02-29 DIAGNOSIS — E782 Mixed hyperlipidemia: Secondary | ICD-10-CM | POA: Diagnosis not present

## 2024-02-29 DIAGNOSIS — R7302 Impaired glucose tolerance (oral): Secondary | ICD-10-CM | POA: Diagnosis not present

## 2024-02-29 DIAGNOSIS — J301 Allergic rhinitis due to pollen: Secondary | ICD-10-CM | POA: Diagnosis not present

## 2024-02-29 DIAGNOSIS — F419 Anxiety disorder, unspecified: Secondary | ICD-10-CM | POA: Diagnosis not present

## 2024-02-29 DIAGNOSIS — Z79899 Other long term (current) drug therapy: Secondary | ICD-10-CM | POA: Diagnosis not present

## 2024-02-29 DIAGNOSIS — Z0000 Encounter for general adult medical examination without abnormal findings: Secondary | ICD-10-CM | POA: Diagnosis not present

## 2024-02-29 DIAGNOSIS — N390 Urinary tract infection, site not specified: Secondary | ICD-10-CM | POA: Diagnosis not present

## 2024-05-31 DIAGNOSIS — Z79899 Other long term (current) drug therapy: Secondary | ICD-10-CM | POA: Diagnosis not present

## 2024-05-31 DIAGNOSIS — I10 Essential (primary) hypertension: Secondary | ICD-10-CM | POA: Diagnosis not present

## 2024-05-31 DIAGNOSIS — J309 Allergic rhinitis, unspecified: Secondary | ICD-10-CM | POA: Diagnosis not present

## 2024-05-31 DIAGNOSIS — K219 Gastro-esophageal reflux disease without esophagitis: Secondary | ICD-10-CM | POA: Diagnosis not present

## 2024-05-31 DIAGNOSIS — F419 Anxiety disorder, unspecified: Secondary | ICD-10-CM | POA: Diagnosis not present

## 2024-05-31 DIAGNOSIS — R7302 Impaired glucose tolerance (oral): Secondary | ICD-10-CM | POA: Diagnosis not present

## 2024-05-31 DIAGNOSIS — E785 Hyperlipidemia, unspecified: Secondary | ICD-10-CM | POA: Diagnosis not present

## 2024-05-31 DIAGNOSIS — F32A Depression, unspecified: Secondary | ICD-10-CM | POA: Diagnosis not present

## 2024-07-06 DIAGNOSIS — N390 Urinary tract infection, site not specified: Secondary | ICD-10-CM | POA: Diagnosis not present

## 2024-07-06 DIAGNOSIS — B370 Candidal stomatitis: Secondary | ICD-10-CM | POA: Diagnosis not present

## 2024-09-14 DIAGNOSIS — N390 Urinary tract infection, site not specified: Secondary | ICD-10-CM | POA: Diagnosis not present

## 2024-09-14 DIAGNOSIS — E785 Hyperlipidemia, unspecified: Secondary | ICD-10-CM | POA: Diagnosis not present

## 2024-09-14 DIAGNOSIS — Z23 Encounter for immunization: Secondary | ICD-10-CM | POA: Diagnosis not present

## 2024-09-14 DIAGNOSIS — Z79899 Other long term (current) drug therapy: Secondary | ICD-10-CM | POA: Diagnosis not present

## 2024-09-14 DIAGNOSIS — I10 Essential (primary) hypertension: Secondary | ICD-10-CM | POA: Diagnosis not present

## 2024-09-14 DIAGNOSIS — E782 Mixed hyperlipidemia: Secondary | ICD-10-CM | POA: Diagnosis not present

## 2024-09-14 DIAGNOSIS — F32A Depression, unspecified: Secondary | ICD-10-CM | POA: Diagnosis not present

## 2024-09-14 DIAGNOSIS — F419 Anxiety disorder, unspecified: Secondary | ICD-10-CM | POA: Diagnosis not present

## 2024-09-14 DIAGNOSIS — R7302 Impaired glucose tolerance (oral): Secondary | ICD-10-CM | POA: Diagnosis not present

## 2024-10-07 DIAGNOSIS — Z1231 Encounter for screening mammogram for malignant neoplasm of breast: Secondary | ICD-10-CM | POA: Diagnosis not present

## 2024-10-26 DIAGNOSIS — N390 Urinary tract infection, site not specified: Secondary | ICD-10-CM | POA: Diagnosis not present
